# Patient Record
Sex: Female | Born: 1940 | Race: White | Hispanic: No | Smoking: Never smoker
Health system: Southern US, Community
[De-identification: ages and names within clinical notes are randomized; demographics above are authoritative.]

## PROBLEM LIST (undated history)

## (undated) DIAGNOSIS — M1990 Unspecified osteoarthritis, unspecified site: Secondary | ICD-10-CM

## (undated) DIAGNOSIS — F5102 Adjustment insomnia: Secondary | ICD-10-CM

## (undated) DIAGNOSIS — E782 Mixed hyperlipidemia: Secondary | ICD-10-CM

## (undated) DIAGNOSIS — E038 Other specified hypothyroidism: Secondary | ICD-10-CM

## (undated) HISTORY — DX: Adjustment insomnia: F51.02

## (undated) HISTORY — PX: TONSILLECTOMY: SUR1361

## (undated) HISTORY — DX: Other specified hypothyroidism: E03.8

## (undated) HISTORY — DX: Mixed hyperlipidemia: E78.2

## (undated) HISTORY — PX: HIP ARTHROPLASTY: SHX981

## (undated) HISTORY — PX: CATARACT EXTRACTION: SUR2

## (undated) HISTORY — PX: JOINT REPLACEMENT: SHX530

## (undated) HISTORY — DX: Unspecified osteoarthritis, unspecified site: M19.90

## (undated) HISTORY — PX: THYROIDECTOMY: SHX17

---

## 1997-05-16 ENCOUNTER — Other Ambulatory Visit: Admission: RE | Admit: 1997-05-16 | Discharge: 1997-05-16 | Payer: Self-pay | Admitting: Obstetrics & Gynecology

## 1997-12-23 ENCOUNTER — Other Ambulatory Visit: Admission: RE | Admit: 1997-12-23 | Discharge: 1997-12-23 | Payer: Self-pay | Admitting: Obstetrics & Gynecology

## 1998-05-29 ENCOUNTER — Other Ambulatory Visit: Admission: RE | Admit: 1998-05-29 | Discharge: 1998-05-29 | Payer: Self-pay | Admitting: Obstetrics & Gynecology

## 1999-07-18 ENCOUNTER — Other Ambulatory Visit: Admission: RE | Admit: 1999-07-18 | Discharge: 1999-07-18 | Payer: Self-pay | Admitting: Obstetrics & Gynecology

## 2000-08-25 ENCOUNTER — Other Ambulatory Visit: Admission: RE | Admit: 2000-08-25 | Discharge: 2000-08-25 | Payer: Self-pay | Admitting: Obstetrics & Gynecology

## 2001-09-16 ENCOUNTER — Other Ambulatory Visit: Admission: RE | Admit: 2001-09-16 | Discharge: 2001-09-16 | Payer: Self-pay | Admitting: Obstetrics & Gynecology

## 2002-10-05 ENCOUNTER — Other Ambulatory Visit: Admission: RE | Admit: 2002-10-05 | Discharge: 2002-10-05 | Payer: Self-pay | Admitting: Obstetrics & Gynecology

## 2003-10-25 ENCOUNTER — Other Ambulatory Visit: Admission: RE | Admit: 2003-10-25 | Discharge: 2003-10-25 | Payer: Self-pay | Admitting: Obstetrics & Gynecology

## 2013-08-05 ENCOUNTER — Other Ambulatory Visit: Payer: Self-pay | Admitting: Obstetrics & Gynecology

## 2013-08-05 DIAGNOSIS — R928 Other abnormal and inconclusive findings on diagnostic imaging of breast: Secondary | ICD-10-CM

## 2013-08-12 ENCOUNTER — Other Ambulatory Visit: Payer: Self-pay | Admitting: Obstetrics & Gynecology

## 2013-08-12 ENCOUNTER — Ambulatory Visit
Admission: RE | Admit: 2013-08-12 | Discharge: 2013-08-12 | Disposition: A | Discharge status: Home / Self Care | Payer: Medicare Other | Source: Ambulatory Visit | Attending: Obstetrics & Gynecology | Admitting: Obstetrics & Gynecology

## 2013-08-12 ENCOUNTER — Encounter (INDEPENDENT_AMBULATORY_CARE_PROVIDER_SITE_OTHER): Payer: Self-pay

## 2013-08-12 DIAGNOSIS — R928 Other abnormal and inconclusive findings on diagnostic imaging of breast: Secondary | ICD-10-CM

## 2015-03-13 DIAGNOSIS — E034 Atrophy of thyroid (acquired): Secondary | ICD-10-CM | POA: Diagnosis not present

## 2015-03-13 DIAGNOSIS — Z23 Encounter for immunization: Secondary | ICD-10-CM | POA: Diagnosis not present

## 2015-03-13 DIAGNOSIS — E782 Mixed hyperlipidemia: Secondary | ICD-10-CM | POA: Diagnosis not present

## 2015-03-13 DIAGNOSIS — E038 Other specified hypothyroidism: Secondary | ICD-10-CM | POA: Diagnosis not present

## 2015-03-13 DIAGNOSIS — F5102 Adjustment insomnia: Secondary | ICD-10-CM | POA: Diagnosis not present

## 2015-07-24 IMAGING — MG MM DIAG BREAST TOMO UNI LEFT
6 series · 6 of 14 positions shown · non-contrast
Comparison: Priors

CLINICAL DATA: Patient recalled from screening for possible left
breast distortion.

EXAM:
DIGITAL DIAGNOSTIC LEFT MAMMOGRAM WITH IMPLANTS, 3D TOMOSYNTHESIS
AND CAD
The patient has retropectoral/retroglandular implants. Standard and
implant displaced views were performed.

[L MLO synth-2D]
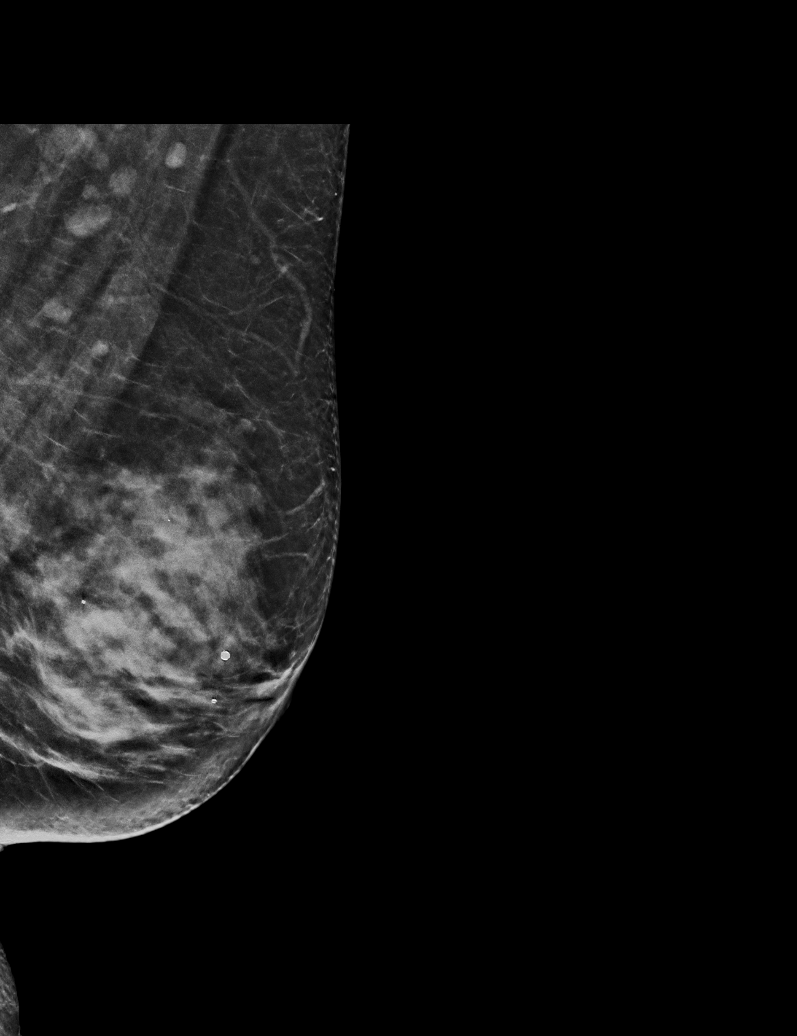

[L CC synth-2D]
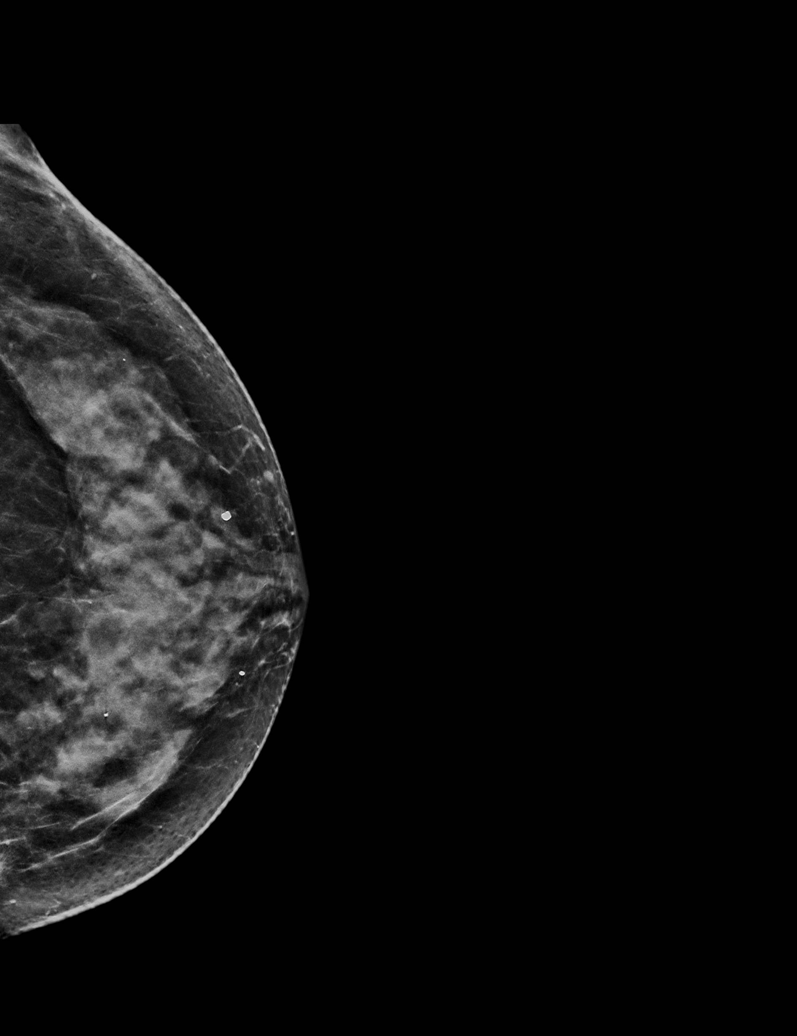

[L CC]
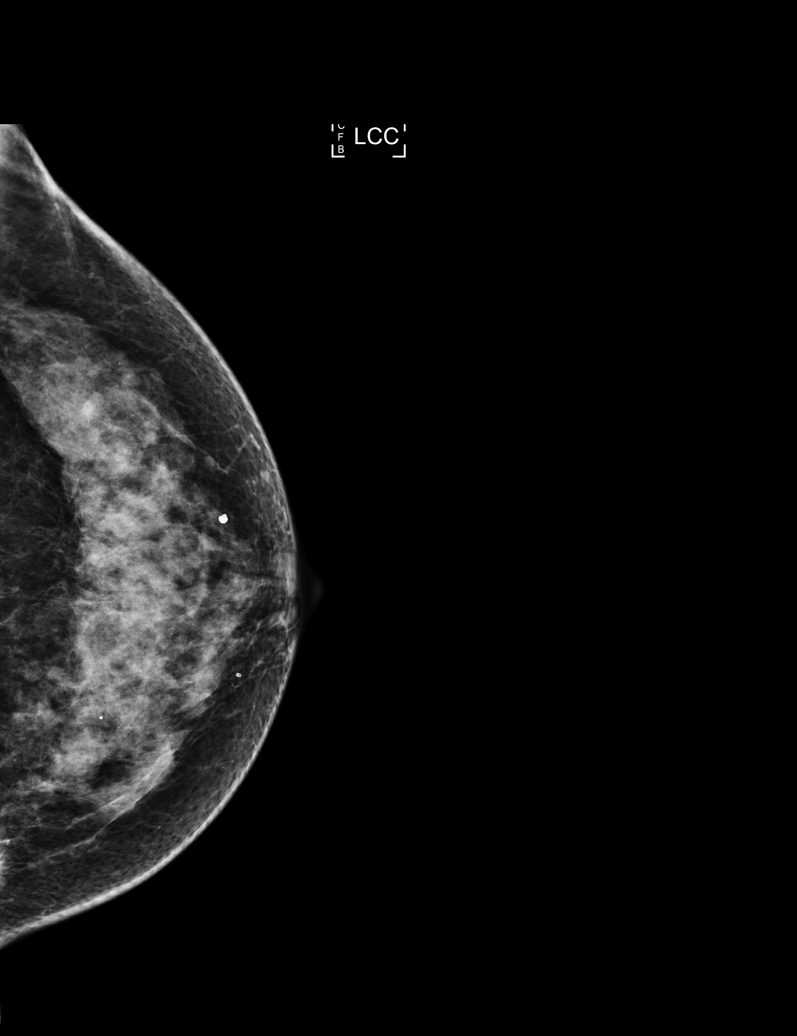

[L MLO]
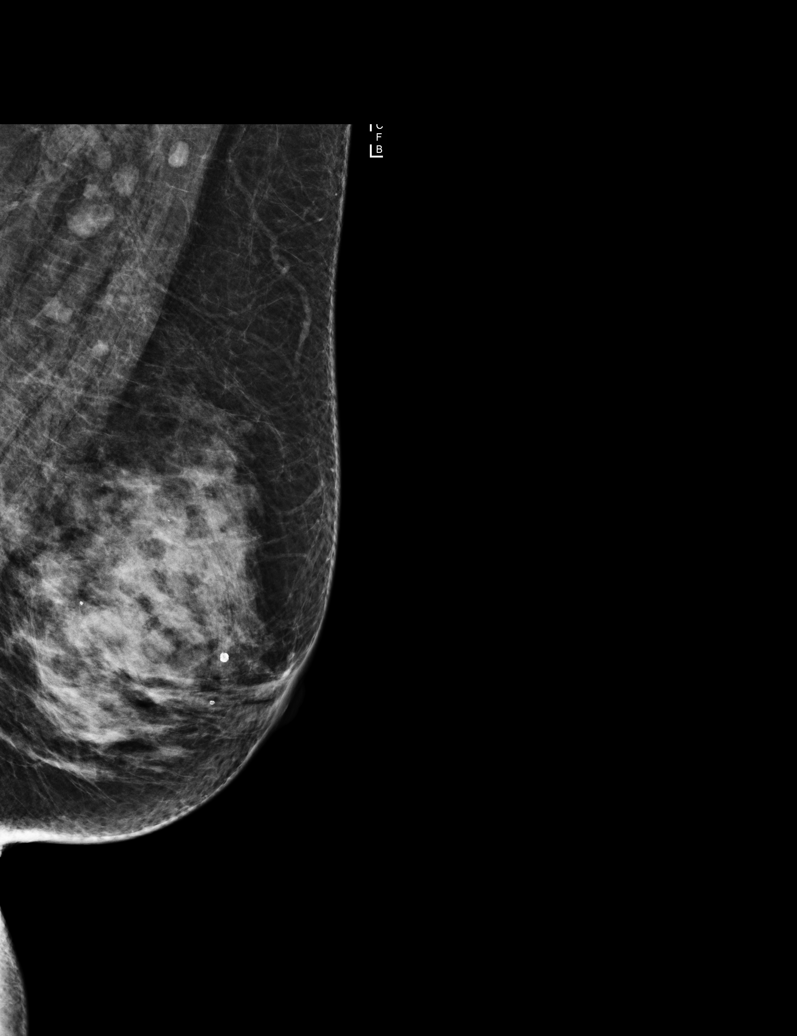

[L CC tomo · tomo slice 27/54.0]
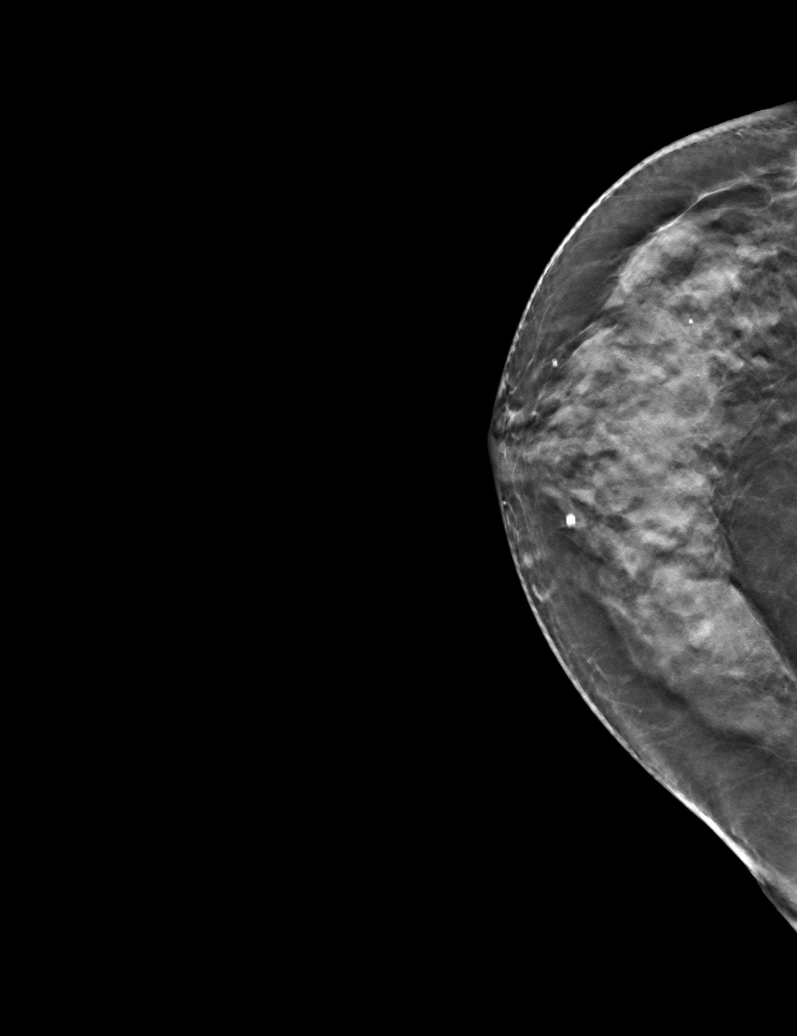

[L MLO tomo · tomo slice 31/60.0]
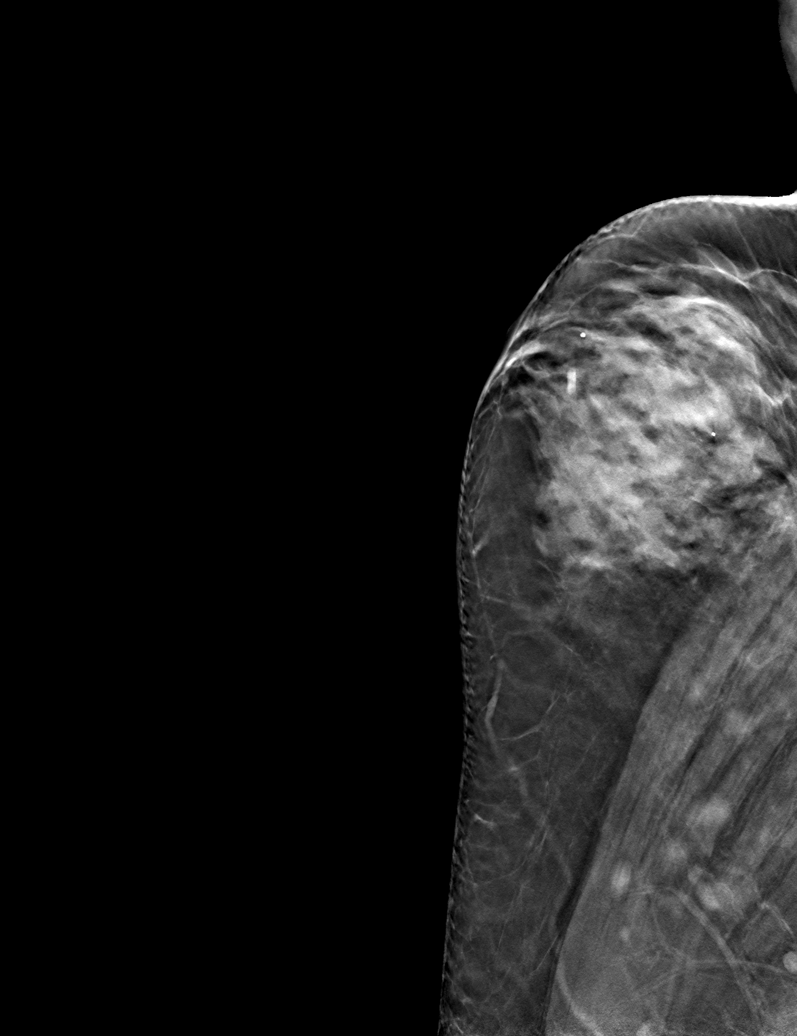

[6 of 14 positions shown; findings below may reference images not displayed]

ACR Breast Density Category c: The breast tissue is heterogeneously
dense, which may obscure small masses.
FINDINGS: Questioned distortion within the posterior lateral aspect of the
left breast on the CC view resolved with additional imaging,
tomosynthesis.

Mammographic images were processed with CAD.
IMPRESSION: No mammographic evidence for malignancy

RECOMMENDATION:
Screening mammogram in one year.(Code:QH-[id number])

I have discussed the findings and recommendations with the patient.
Results were also provided in writing at the conclusion of the
visit. If applicable, a reminder letter will be sent to the patient
regarding the next appointment.

BI-RADS CATEGORY  1: Negative.

## 2015-08-11 DIAGNOSIS — R21 Rash and other nonspecific skin eruption: Secondary | ICD-10-CM | POA: Diagnosis not present

## 2015-09-11 DIAGNOSIS — Z0000 Encounter for general adult medical examination without abnormal findings: Secondary | ICD-10-CM | POA: Diagnosis not present

## 2015-09-11 DIAGNOSIS — E038 Other specified hypothyroidism: Secondary | ICD-10-CM | POA: Diagnosis not present

## 2015-09-11 DIAGNOSIS — E782 Mixed hyperlipidemia: Secondary | ICD-10-CM | POA: Diagnosis not present

## 2015-09-11 DIAGNOSIS — Z6823 Body mass index (BMI) 23.0-23.9, adult: Secondary | ICD-10-CM | POA: Diagnosis not present

## 2015-10-16 DIAGNOSIS — Z124 Encounter for screening for malignant neoplasm of cervix: Secondary | ICD-10-CM | POA: Diagnosis not present

## 2015-10-16 DIAGNOSIS — Z01419 Encounter for gynecological examination (general) (routine) without abnormal findings: Secondary | ICD-10-CM | POA: Diagnosis not present

## 2015-10-16 DIAGNOSIS — Z1231 Encounter for screening mammogram for malignant neoplasm of breast: Secondary | ICD-10-CM | POA: Diagnosis not present

## 2015-10-16 DIAGNOSIS — R8271 Bacteriuria: Secondary | ICD-10-CM | POA: Diagnosis not present

## 2015-10-16 DIAGNOSIS — Z6822 Body mass index (BMI) 22.0-22.9, adult: Secondary | ICD-10-CM | POA: Diagnosis not present

## 2015-10-31 DIAGNOSIS — N3020 Other chronic cystitis without hematuria: Secondary | ICD-10-CM | POA: Diagnosis not present

## 2015-10-31 DIAGNOSIS — N870 Mild cervical dysplasia: Secondary | ICD-10-CM | POA: Diagnosis not present

## 2015-10-31 DIAGNOSIS — R87610 Atypical squamous cells of undetermined significance on cytologic smear of cervix (ASC-US): Secondary | ICD-10-CM | POA: Diagnosis not present

## 2015-10-31 DIAGNOSIS — R319 Hematuria, unspecified: Secondary | ICD-10-CM | POA: Diagnosis not present

## 2015-11-14 DIAGNOSIS — R87612 Low grade squamous intraepithelial lesion on cytologic smear of cervix (LGSIL): Secondary | ICD-10-CM | POA: Diagnosis not present

## 2016-02-05 DIAGNOSIS — R87612 Low grade squamous intraepithelial lesion on cytologic smear of cervix (LGSIL): Secondary | ICD-10-CM | POA: Diagnosis not present

## 2016-02-05 DIAGNOSIS — Z124 Encounter for screening for malignant neoplasm of cervix: Secondary | ICD-10-CM | POA: Diagnosis not present

## 2016-04-23 DIAGNOSIS — M9902 Segmental and somatic dysfunction of thoracic region: Secondary | ICD-10-CM | POA: Diagnosis not present

## 2016-04-23 DIAGNOSIS — M9905 Segmental and somatic dysfunction of pelvic region: Secondary | ICD-10-CM | POA: Diagnosis not present

## 2016-04-23 DIAGNOSIS — M545 Low back pain: Secondary | ICD-10-CM | POA: Diagnosis not present

## 2016-04-23 DIAGNOSIS — M62830 Muscle spasm of back: Secondary | ICD-10-CM | POA: Diagnosis not present

## 2016-04-23 DIAGNOSIS — M9903 Segmental and somatic dysfunction of lumbar region: Secondary | ICD-10-CM | POA: Diagnosis not present

## 2016-05-22 DIAGNOSIS — H25813 Combined forms of age-related cataract, bilateral: Secondary | ICD-10-CM | POA: Diagnosis not present

## 2016-05-22 DIAGNOSIS — H5203 Hypermetropia, bilateral: Secondary | ICD-10-CM | POA: Diagnosis not present

## 2016-05-27 DIAGNOSIS — M25551 Pain in right hip: Secondary | ICD-10-CM | POA: Diagnosis not present

## 2016-05-27 DIAGNOSIS — M47816 Spondylosis without myelopathy or radiculopathy, lumbar region: Secondary | ICD-10-CM | POA: Diagnosis not present

## 2016-05-27 DIAGNOSIS — M5186 Other intervertebral disc disorders, lumbar region: Secondary | ICD-10-CM | POA: Diagnosis not present

## 2016-05-27 DIAGNOSIS — M545 Low back pain: Secondary | ICD-10-CM | POA: Diagnosis not present

## 2016-05-27 DIAGNOSIS — M87851 Other osteonecrosis, right femur: Secondary | ICD-10-CM | POA: Diagnosis not present

## 2016-05-27 DIAGNOSIS — M1611 Unilateral primary osteoarthritis, right hip: Secondary | ICD-10-CM | POA: Diagnosis not present

## 2016-06-06 DIAGNOSIS — M87051 Idiopathic aseptic necrosis of right femur: Secondary | ICD-10-CM | POA: Diagnosis not present

## 2016-06-18 DIAGNOSIS — M1611 Unilateral primary osteoarthritis, right hip: Secondary | ICD-10-CM | POA: Diagnosis not present

## 2016-06-25 DIAGNOSIS — Z01810 Encounter for preprocedural cardiovascular examination: Secondary | ICD-10-CM | POA: Diagnosis not present

## 2016-06-25 DIAGNOSIS — Z01818 Encounter for other preprocedural examination: Secondary | ICD-10-CM | POA: Diagnosis not present

## 2016-06-25 DIAGNOSIS — M79609 Pain in unspecified limb: Secondary | ICD-10-CM | POA: Diagnosis not present

## 2016-06-25 DIAGNOSIS — Z79899 Other long term (current) drug therapy: Secondary | ICD-10-CM | POA: Diagnosis not present

## 2016-06-25 DIAGNOSIS — I700 Atherosclerosis of aorta: Secondary | ICD-10-CM | POA: Diagnosis not present

## 2016-06-25 DIAGNOSIS — R52 Pain, unspecified: Secondary | ICD-10-CM | POA: Diagnosis not present

## 2016-07-04 DIAGNOSIS — R8299 Other abnormal findings in urine: Secondary | ICD-10-CM | POA: Diagnosis not present

## 2016-07-04 DIAGNOSIS — Z01810 Encounter for preprocedural cardiovascular examination: Secondary | ICD-10-CM | POA: Diagnosis not present

## 2016-07-04 DIAGNOSIS — N3000 Acute cystitis without hematuria: Secondary | ICD-10-CM | POA: Diagnosis not present

## 2016-08-06 DIAGNOSIS — Z01818 Encounter for other preprocedural examination: Secondary | ICD-10-CM | POA: Diagnosis not present

## 2016-08-06 DIAGNOSIS — M79609 Pain in unspecified limb: Secondary | ICD-10-CM | POA: Diagnosis not present

## 2016-08-06 DIAGNOSIS — R52 Pain, unspecified: Secondary | ICD-10-CM | POA: Diagnosis not present

## 2016-08-06 DIAGNOSIS — Z79899 Other long term (current) drug therapy: Secondary | ICD-10-CM | POA: Diagnosis not present

## 2016-08-20 DIAGNOSIS — Z01818 Encounter for other preprocedural examination: Secondary | ICD-10-CM | POA: Diagnosis not present

## 2016-08-20 DIAGNOSIS — M87051 Idiopathic aseptic necrosis of right femur: Secondary | ICD-10-CM | POA: Diagnosis not present

## 2016-08-28 DIAGNOSIS — M87851 Other osteonecrosis, right femur: Secondary | ICD-10-CM | POA: Diagnosis not present

## 2016-08-28 DIAGNOSIS — Z79899 Other long term (current) drug therapy: Secondary | ICD-10-CM | POA: Diagnosis not present

## 2016-08-28 DIAGNOSIS — Z471 Aftercare following joint replacement surgery: Secondary | ICD-10-CM | POA: Diagnosis not present

## 2016-08-28 DIAGNOSIS — M1611 Unilateral primary osteoarthritis, right hip: Secondary | ICD-10-CM | POA: Diagnosis not present

## 2016-08-28 DIAGNOSIS — E039 Hypothyroidism, unspecified: Secondary | ICD-10-CM | POA: Diagnosis not present

## 2016-08-28 DIAGNOSIS — R531 Weakness: Secondary | ICD-10-CM | POA: Diagnosis not present

## 2016-08-28 DIAGNOSIS — D649 Anemia, unspecified: Secondary | ICD-10-CM | POA: Diagnosis not present

## 2016-08-28 DIAGNOSIS — M87051 Idiopathic aseptic necrosis of right femur: Secondary | ICD-10-CM | POA: Diagnosis not present

## 2016-08-28 DIAGNOSIS — Z96641 Presence of right artificial hip joint: Secondary | ICD-10-CM | POA: Diagnosis not present

## 2016-08-28 DIAGNOSIS — Z9181 History of falling: Secondary | ICD-10-CM | POA: Diagnosis not present

## 2016-08-28 DIAGNOSIS — E785 Hyperlipidemia, unspecified: Secondary | ICD-10-CM | POA: Diagnosis not present

## 2016-08-30 DIAGNOSIS — Z471 Aftercare following joint replacement surgery: Secondary | ICD-10-CM | POA: Diagnosis not present

## 2016-08-30 DIAGNOSIS — E039 Hypothyroidism, unspecified: Secondary | ICD-10-CM | POA: Diagnosis not present

## 2016-08-30 DIAGNOSIS — Z7982 Long term (current) use of aspirin: Secondary | ICD-10-CM | POA: Diagnosis not present

## 2016-08-30 DIAGNOSIS — E785 Hyperlipidemia, unspecified: Secondary | ICD-10-CM | POA: Diagnosis not present

## 2016-08-30 DIAGNOSIS — Z96641 Presence of right artificial hip joint: Secondary | ICD-10-CM | POA: Diagnosis not present

## 2016-09-04 DIAGNOSIS — D649 Anemia, unspecified: Secondary | ICD-10-CM | POA: Diagnosis not present

## 2016-09-10 DIAGNOSIS — Z96641 Presence of right artificial hip joint: Secondary | ICD-10-CM | POA: Diagnosis not present

## 2016-09-10 DIAGNOSIS — M25511 Pain in right shoulder: Secondary | ICD-10-CM | POA: Diagnosis not present

## 2016-09-10 DIAGNOSIS — M87851 Other osteonecrosis, right femur: Secondary | ICD-10-CM | POA: Diagnosis not present

## 2016-09-16 DIAGNOSIS — R2689 Other abnormalities of gait and mobility: Secondary | ICD-10-CM | POA: Diagnosis not present

## 2016-09-16 DIAGNOSIS — M6281 Muscle weakness (generalized): Secondary | ICD-10-CM | POA: Diagnosis not present

## 2016-09-16 DIAGNOSIS — M25551 Pain in right hip: Secondary | ICD-10-CM | POA: Diagnosis not present

## 2016-09-23 DIAGNOSIS — M25551 Pain in right hip: Secondary | ICD-10-CM | POA: Diagnosis not present

## 2016-09-23 DIAGNOSIS — M6281 Muscle weakness (generalized): Secondary | ICD-10-CM | POA: Diagnosis not present

## 2016-09-23 DIAGNOSIS — R2689 Other abnormalities of gait and mobility: Secondary | ICD-10-CM | POA: Diagnosis not present

## 2016-09-24 DIAGNOSIS — D649 Anemia, unspecified: Secondary | ICD-10-CM | POA: Diagnosis not present

## 2016-10-01 DIAGNOSIS — R2689 Other abnormalities of gait and mobility: Secondary | ICD-10-CM | POA: Diagnosis not present

## 2016-10-01 DIAGNOSIS — M6281 Muscle weakness (generalized): Secondary | ICD-10-CM | POA: Diagnosis not present

## 2016-10-01 DIAGNOSIS — M25551 Pain in right hip: Secondary | ICD-10-CM | POA: Diagnosis not present

## 2016-10-08 DIAGNOSIS — M1611 Unilateral primary osteoarthritis, right hip: Secondary | ICD-10-CM | POA: Diagnosis not present

## 2016-10-08 DIAGNOSIS — M6281 Muscle weakness (generalized): Secondary | ICD-10-CM | POA: Diagnosis not present

## 2016-10-08 DIAGNOSIS — M25551 Pain in right hip: Secondary | ICD-10-CM | POA: Diagnosis not present

## 2016-10-08 DIAGNOSIS — R2689 Other abnormalities of gait and mobility: Secondary | ICD-10-CM | POA: Diagnosis not present

## 2016-10-30 DIAGNOSIS — E782 Mixed hyperlipidemia: Secondary | ICD-10-CM | POA: Diagnosis not present

## 2016-10-30 DIAGNOSIS — Z0000 Encounter for general adult medical examination without abnormal findings: Secondary | ICD-10-CM | POA: Diagnosis not present

## 2016-10-30 DIAGNOSIS — Z1211 Encounter for screening for malignant neoplasm of colon: Secondary | ICD-10-CM | POA: Diagnosis not present

## 2016-10-30 DIAGNOSIS — Z6823 Body mass index (BMI) 23.0-23.9, adult: Secondary | ICD-10-CM | POA: Diagnosis not present

## 2016-11-01 DIAGNOSIS — E782 Mixed hyperlipidemia: Secondary | ICD-10-CM | POA: Diagnosis not present

## 2016-11-19 DIAGNOSIS — Z96641 Presence of right artificial hip joint: Secondary | ICD-10-CM | POA: Diagnosis not present

## 2016-12-03 DIAGNOSIS — R87612 Low grade squamous intraepithelial lesion on cytologic smear of cervix (LGSIL): Secondary | ICD-10-CM | POA: Diagnosis not present

## 2016-12-03 DIAGNOSIS — Z01419 Encounter for gynecological examination (general) (routine) without abnormal findings: Secondary | ICD-10-CM | POA: Diagnosis not present

## 2016-12-03 DIAGNOSIS — Z1231 Encounter for screening mammogram for malignant neoplasm of breast: Secondary | ICD-10-CM | POA: Diagnosis not present

## 2016-12-03 DIAGNOSIS — Z124 Encounter for screening for malignant neoplasm of cervix: Secondary | ICD-10-CM | POA: Diagnosis not present

## 2016-12-03 DIAGNOSIS — Z6823 Body mass index (BMI) 23.0-23.9, adult: Secondary | ICD-10-CM | POA: Diagnosis not present

## 2017-02-20 DIAGNOSIS — Z96641 Presence of right artificial hip joint: Secondary | ICD-10-CM | POA: Diagnosis not present

## 2017-08-20 DIAGNOSIS — H04123 Dry eye syndrome of bilateral lacrimal glands: Secondary | ICD-10-CM | POA: Diagnosis not present

## 2017-08-25 DIAGNOSIS — Z96641 Presence of right artificial hip joint: Secondary | ICD-10-CM | POA: Diagnosis not present

## 2017-09-23 DIAGNOSIS — E782 Mixed hyperlipidemia: Secondary | ICD-10-CM | POA: Diagnosis not present

## 2017-09-23 DIAGNOSIS — E038 Other specified hypothyroidism: Secondary | ICD-10-CM | POA: Diagnosis not present

## 2017-10-14 DIAGNOSIS — H25811 Combined forms of age-related cataract, right eye: Secondary | ICD-10-CM | POA: Diagnosis not present

## 2017-10-14 DIAGNOSIS — H353131 Nonexudative age-related macular degeneration, bilateral, early dry stage: Secondary | ICD-10-CM | POA: Diagnosis not present

## 2017-10-14 DIAGNOSIS — Z01818 Encounter for other preprocedural examination: Secondary | ICD-10-CM | POA: Diagnosis not present

## 2017-10-21 DIAGNOSIS — H04123 Dry eye syndrome of bilateral lacrimal glands: Secondary | ICD-10-CM | POA: Diagnosis not present

## 2017-10-21 DIAGNOSIS — E039 Hypothyroidism, unspecified: Secondary | ICD-10-CM | POA: Diagnosis not present

## 2017-10-21 DIAGNOSIS — H25811 Combined forms of age-related cataract, right eye: Secondary | ICD-10-CM | POA: Diagnosis not present

## 2017-10-21 DIAGNOSIS — H43393 Other vitreous opacities, bilateral: Secondary | ICD-10-CM | POA: Diagnosis not present

## 2017-10-21 DIAGNOSIS — E785 Hyperlipidemia, unspecified: Secondary | ICD-10-CM | POA: Diagnosis not present

## 2017-10-21 DIAGNOSIS — D649 Anemia, unspecified: Secondary | ICD-10-CM | POA: Diagnosis not present

## 2017-10-21 DIAGNOSIS — Z79899 Other long term (current) drug therapy: Secondary | ICD-10-CM | POA: Diagnosis not present

## 2017-10-21 DIAGNOSIS — H259 Unspecified age-related cataract: Secondary | ICD-10-CM | POA: Diagnosis not present

## 2017-10-28 HISTORY — PX: CATARACT EXTRACTION: SUR2

## 2017-11-03 DIAGNOSIS — Z0000 Encounter for general adult medical examination without abnormal findings: Secondary | ICD-10-CM | POA: Diagnosis not present

## 2017-11-03 DIAGNOSIS — Z1211 Encounter for screening for malignant neoplasm of colon: Secondary | ICD-10-CM | POA: Diagnosis not present

## 2017-11-03 DIAGNOSIS — E038 Other specified hypothyroidism: Secondary | ICD-10-CM | POA: Diagnosis not present

## 2017-11-03 DIAGNOSIS — Z6823 Body mass index (BMI) 23.0-23.9, adult: Secondary | ICD-10-CM | POA: Diagnosis not present

## 2017-11-03 DIAGNOSIS — Z23 Encounter for immunization: Secondary | ICD-10-CM | POA: Diagnosis not present

## 2017-11-03 DIAGNOSIS — E782 Mixed hyperlipidemia: Secondary | ICD-10-CM | POA: Diagnosis not present

## 2017-11-18 DIAGNOSIS — Z79899 Other long term (current) drug therapy: Secondary | ICD-10-CM | POA: Diagnosis not present

## 2017-11-18 DIAGNOSIS — E0500 Thyrotoxicosis with diffuse goiter without thyrotoxic crisis or storm: Secondary | ICD-10-CM | POA: Diagnosis not present

## 2017-11-18 DIAGNOSIS — H25812 Combined forms of age-related cataract, left eye: Secondary | ICD-10-CM | POA: Diagnosis not present

## 2017-11-18 DIAGNOSIS — H259 Unspecified age-related cataract: Secondary | ICD-10-CM | POA: Diagnosis not present

## 2017-11-18 DIAGNOSIS — E785 Hyperlipidemia, unspecified: Secondary | ICD-10-CM | POA: Diagnosis not present

## 2018-02-02 DIAGNOSIS — Z1211 Encounter for screening for malignant neoplasm of colon: Secondary | ICD-10-CM | POA: Diagnosis not present

## 2018-02-17 DIAGNOSIS — Z124 Encounter for screening for malignant neoplasm of cervix: Secondary | ICD-10-CM | POA: Diagnosis not present

## 2018-02-17 DIAGNOSIS — Z6824 Body mass index (BMI) 24.0-24.9, adult: Secondary | ICD-10-CM | POA: Diagnosis not present

## 2018-02-17 DIAGNOSIS — Z01419 Encounter for gynecological examination (general) (routine) without abnormal findings: Secondary | ICD-10-CM | POA: Diagnosis not present

## 2018-02-17 DIAGNOSIS — Z1231 Encounter for screening mammogram for malignant neoplasm of breast: Secondary | ICD-10-CM | POA: Diagnosis not present

## 2018-11-05 DIAGNOSIS — Z0000 Encounter for general adult medical examination without abnormal findings: Secondary | ICD-10-CM | POA: Diagnosis not present

## 2018-11-05 DIAGNOSIS — Z23 Encounter for immunization: Secondary | ICD-10-CM | POA: Diagnosis not present

## 2018-11-17 DIAGNOSIS — E782 Mixed hyperlipidemia: Secondary | ICD-10-CM | POA: Diagnosis not present

## 2018-11-17 DIAGNOSIS — E038 Other specified hypothyroidism: Secondary | ICD-10-CM | POA: Diagnosis not present

## 2018-12-25 DIAGNOSIS — S0181XA Laceration without foreign body of other part of head, initial encounter: Secondary | ICD-10-CM | POA: Diagnosis not present

## 2018-12-25 DIAGNOSIS — Z9181 History of falling: Secondary | ICD-10-CM | POA: Diagnosis not present

## 2019-04-19 DIAGNOSIS — Z1231 Encounter for screening mammogram for malignant neoplasm of breast: Secondary | ICD-10-CM | POA: Diagnosis not present

## 2019-04-19 DIAGNOSIS — Z6824 Body mass index (BMI) 24.0-24.9, adult: Secondary | ICD-10-CM | POA: Diagnosis not present

## 2019-04-19 DIAGNOSIS — Z01419 Encounter for gynecological examination (general) (routine) without abnormal findings: Secondary | ICD-10-CM | POA: Diagnosis not present

## 2019-04-29 ENCOUNTER — Encounter: Payer: Self-pay | Admitting: Family Medicine

## 2019-05-20 ENCOUNTER — Encounter: Payer: Self-pay | Admitting: Family Medicine

## 2019-05-20 DIAGNOSIS — E038 Other specified hypothyroidism: Secondary | ICD-10-CM | POA: Insufficient documentation

## 2019-05-20 NOTE — Progress Notes (Signed)
Subjective:  Patient ID: Janet Richardson, female    DOB: 06-Dec-1940  Age: 79 y.o. MRN: [id number]  Chief Complaint  Patient presents with  . Hyperlipidemia  . Hypothyroidism    HPI In regard to the other specified hypothyroidism, she is currently taking Synthroid, 100 mcg daily.  TSH was last checked 6 months ago.  The result was reported as normal.  She denies any related symptoms.      Pt presents with hyperlipidemia.  She is currently on zocor. Compliance with treatment has been good; she takes her medication as directed, maintains her low cholesterol diet, follows up as directed, and maintains her exercise regimen.  She denies experiencing any hypercholesterolemia related symptoms.    Social Hx   Social History   Socioeconomic History  . Marital status: Divorced    Spouse name: Not on file  . Number of children: 2  . Years of education: Not on file  . Highest education level: Not on file  Occupational History  . Occupation: Print production planner    CommentManufacturing engineer  Tobacco Use  . Smoking status: Never Smoker  . Smokeless tobacco: Never Used  Substance and Sexual Activity  . Alcohol use: Yes    Alcohol/week: 1.0 standard drinks    Types: 1 Glasses of wine per week  . Drug use: Never  . Sexual activity: Not Currently  Other Topics Concern  . Not on file  Social History Narrative  . Not on file   Social Determinants of Health   Financial Resource Strain:   . Difficulty of Paying Living Expenses:   Food Insecurity:   . Worried About Programme researcher, broadcasting/film/video in the Last Year:   . Barista in the Last Year:   Transportation Needs:   . Freight forwarder (Medical):   Marland Kitchen Lack of Transportation (Non-Medical):   Physical Activity:   . Days of Exercise per Week:   . Minutes of Exercise per Session:   Stress:   . Feeling of Stress :   Social Connections:   . Frequency of Communication with Friends and Family:   . Frequency of Social Gatherings with  Friends and Family:   . Attends Religious Services:   . Active Member of Clubs or Organizations:   . Attends Banker Meetings:   Marland Kitchen Marital Status:    Past Medical History:  Diagnosis Date  . Adjustment insomnia   . Mixed hyperlipidemia   . Osteoarthritis   . Other specified hypothyroidism    Family History  Problem Relation Age of Onset  . Heart failure Father   . Cancer Brother        lung  . Cancer Brother        prostate  . Cancer Brother        prostate    Review of Systems  Constitutional: Negative for chills, fatigue and fever.  HENT: Negative for congestion, ear pain, rhinorrhea and sore throat.   Respiratory: Negative for cough and shortness of breath.   Cardiovascular: Negative for chest pain.  Gastrointestinal: Negative for abdominal pain, constipation, diarrhea, nausea and vomiting.  Genitourinary: Negative for dysuria and urgency.  Musculoskeletal: Negative for arthralgias, back pain and myalgias.  Neurological: Negative for dizziness.  Psychiatric/Behavioral: Negative for dysphoric mood. The patient is not nervous/anxious.        No anhedonia.     Objective:  BP 130/70   Pulse 60   Temp 98.7 F (37.1 C)  Resp 16   Ht 5\' 5"  (1.651 m)   Wt 149 lb 9.6 oz (67.9 kg)   BMI 24.89 kg/m   BP/Weight 6/60/6301  Systolic BP 601  Diastolic BP 70  Wt. (Lbs) 149.6  BMI 24.89    Physical Exam Vitals reviewed.  Constitutional:      General: She is not in acute distress.    Appearance: Normal appearance. She is normal weight. She is not ill-appearing.  Neck:     Vascular: No carotid bruit.  Cardiovascular:     Rate and Rhythm: Normal rate and regular rhythm.     Heart sounds: Normal heart sounds.  Pulmonary:     Effort: Pulmonary effort is normal. No respiratory distress.     Breath sounds: Normal breath sounds.  Abdominal:     General: Bowel sounds are normal.     Palpations: Abdomen is soft.     Tenderness: There is no abdominal  tenderness.  Neurological:     Mental Status: She is alert.  Psychiatric:        Mood and Affect: Mood normal.        Behavior: Behavior normal.     No results found for: WBC, HGB, HCT, PLT, GLUCOSE, CHOL, TRIG, HDL, LDLDIRECT, LDLCALC, ALT, AST, NA, K, CL, CREATININE, BUN, CO2, TSH, PSA, INR, GLUF, HGBA1C, MICROALBUR    Assessment & Plan:   1. Other specified hypothyroidism The current medical regimen is effective;  continue present plan and medications. Future orders - TSH  2. Mixed hyperlipidemia Well controlled.  No changes to medicines.  Continue to work on eating a healthy diet and exercise.  Labs reviewed.  Future orders.  - Lipid panel - Comprehensive metabolic panel - CBC with Differential/Platelet  3. Other specified menopausal and perimenopausal disorders Order dexa - DG Bone Density; Future   Follow-up: Return in about 1 year (around 05/23/2020) for fasting visit with me. AWV with Maudie Mercury in October after 11/06/2019.Marland Kitchen  An After Visit Summary was printed and given to the patient.  Rochel Brome [facility] [phone]

## 2019-05-24 ENCOUNTER — Other Ambulatory Visit: Payer: Self-pay

## 2019-05-24 ENCOUNTER — Encounter: Payer: Self-pay | Admitting: Family Medicine

## 2019-05-24 ENCOUNTER — Ambulatory Visit (INDEPENDENT_AMBULATORY_CARE_PROVIDER_SITE_OTHER): Payer: PPO | Admitting: Family Medicine

## 2019-05-24 VITALS — BP 130/70 | HR 60 | Temp 98.7°F | Resp 16 | Ht 65.0 in | Wt 149.6 lb

## 2019-05-24 DIAGNOSIS — E782 Mixed hyperlipidemia: Secondary | ICD-10-CM

## 2019-05-24 DIAGNOSIS — E038 Other specified hypothyroidism: Secondary | ICD-10-CM | POA: Diagnosis not present

## 2019-05-24 DIAGNOSIS — N958 Other specified menopausal and perimenopausal disorders: Secondary | ICD-10-CM | POA: Insufficient documentation

## 2019-05-25 LAB — COMPREHENSIVE METABOLIC PANEL
ALT: 18 IU/L (ref 0–32)
AST: 20 IU/L (ref 0–40)
Albumin/Globulin Ratio: 2.1 (ref 1.2–2.2)
Albumin: 4.8 g/dL — ABNORMAL HIGH (ref 3.7–4.7)
Alkaline Phosphatase: 75 IU/L (ref 39–117)
BUN/Creatinine Ratio: 24 (ref 12–28)
BUN: 19 mg/dL (ref 8–27)
Bilirubin Total: 0.9 mg/dL (ref 0.0–1.2)
CO2: 23 mmol/L (ref 20–29)
Calcium: 9.8 mg/dL (ref 8.7–10.3)
Chloride: 101 mmol/L (ref 96–106)
Creatinine, Ser: 0.79 mg/dL (ref 0.57–1.00)
GFR calc Af Amer: 84 mL/min/{1.73_m2} (ref >59)
GFR calc non Af Amer: 72 mL/min/{1.73_m2} (ref >59)
Globulin, Total: 2.3 g/dL (ref 1.5–4.5)
Glucose: 87 mg/dL (ref 65–99)
Potassium: 4.4 mmol/L (ref 3.5–5.2)
Sodium: 141 mmol/L (ref 134–144)
Total Protein: 7.1 g/dL (ref 6.0–8.5)

## 2019-05-25 LAB — CBC WITH DIFFERENTIAL/PLATELET
Basophils Absolute: 0.1 10*3/uL (ref 0.0–0.2)
Basos: 1 %
EOS (ABSOLUTE): 0.2 10*3/uL (ref 0.0–0.4)
Eos: 2 %
Hematocrit: 44.3 % (ref 34.0–46.6)
Hemoglobin: 14.6 g/dL (ref 11.1–15.9)
Immature Grans (Abs): 0 10*3/uL (ref 0.0–0.1)
Immature Granulocytes: 0 %
Lymphocytes Absolute: 2.1 10*3/uL (ref 0.7–3.1)
Lymphs: 25 %
MCH: 32.4 pg (ref 26.6–33.0)
MCHC: 33 g/dL (ref 31.5–35.7)
MCV: 98 fL — ABNORMAL HIGH (ref 79–97)
Monocytes Absolute: 0.7 10*3/uL (ref 0.1–0.9)
Monocytes: 9 %
Neutrophils Absolute: 5.4 10*3/uL (ref 1.4–7.0)
Neutrophils: 63 %
Platelets: 332 10*3/uL (ref 150–450)
RBC: 4.5 x10E6/uL (ref 3.77–5.28)
RDW: 12.5 % (ref 11.7–15.4)
WBC: 8.5 10*3/uL (ref 3.4–10.8)

## 2019-05-25 LAB — LIPID PANEL
Chol/HDL Ratio: 2.5 ratio (ref 0.0–4.4)
Cholesterol, Total: 215 mg/dL — ABNORMAL HIGH (ref 100–199)
HDL: 85 mg/dL (ref >39)
LDL Chol Calc (NIH): 116 mg/dL — ABNORMAL HIGH (ref 0–99)
Triglycerides: 81 mg/dL (ref 0–149)
VLDL Cholesterol Cal: 14 mg/dL (ref 5–40)

## 2019-05-25 LAB — TSH: TSH: 3.13 u[IU]/mL (ref 0.450–4.500)

## 2019-05-25 LAB — CARDIOVASCULAR RISK ASSESSMENT

## 2019-07-12 ENCOUNTER — Other Ambulatory Visit: Payer: Self-pay | Admitting: Family Medicine

## 2019-08-31 ENCOUNTER — Other Ambulatory Visit: Payer: Self-pay | Admitting: Family Medicine

## 2019-11-23 ENCOUNTER — Other Ambulatory Visit: Payer: Self-pay

## 2019-11-23 ENCOUNTER — Encounter: Payer: Self-pay | Admitting: Nurse Practitioner

## 2019-11-23 ENCOUNTER — Ambulatory Visit (INDEPENDENT_AMBULATORY_CARE_PROVIDER_SITE_OTHER): Payer: PPO | Admitting: Nurse Practitioner

## 2019-11-23 VITALS — BP 109/64 | HR 76 | Temp 98.0°F | Ht 64.0 in | Wt 149.4 lb

## 2019-11-23 DIAGNOSIS — Z7189 Other specified counseling: Secondary | ICD-10-CM

## 2019-11-23 DIAGNOSIS — Z780 Asymptomatic menopausal state: Secondary | ICD-10-CM

## 2019-11-23 DIAGNOSIS — Z23 Encounter for immunization: Secondary | ICD-10-CM | POA: Diagnosis not present

## 2019-11-23 DIAGNOSIS — Z0000 Encounter for general adult medical examination without abnormal findings: Secondary | ICD-10-CM

## 2019-11-23 DIAGNOSIS — Z13820 Encounter for screening for osteoporosis: Secondary | ICD-10-CM | POA: Diagnosis not present

## 2019-11-23 DIAGNOSIS — Z6825 Body mass index (BMI) 25.0-25.9, adult: Secondary | ICD-10-CM | POA: Diagnosis not present

## 2019-11-23 NOTE — Progress Notes (Signed)
Subjective:  Patient ID: Janet Richardson, female    DOB: 10-16-1940  Age: 79 y.o. MRN: [id number]  Chief Complaint  Patient presents with  . Annual Exam    AWV    HPI  Janet Richardson is a 79 year old Caucasian female present for Annual Medicare Wellness exam. She denies any problems today. She has received all immunizations recommended for her age including COVID-19. She received flu vaccine today. She is up-to-date on mammogram, colonoscopy, dental, and eye exam. She has a past medical history of hypothyroidism, hyperlipidemia, and osteoarthritis with right hip arthroplasty. She exercises regularly and consumes a heart healthy diet.BMI is 25.64.  Encounter for general adult medical examination without abnormal findings Physical ("At Risk" items are starred): Patient's last physical exam was 1 year ago .  Smoking: Life-long non-smoker ;  Physical Activity: Exercises at least 3 times per week ;  Alcohol/Drug Use: Is a non-drinker ; No illicit drug use ;  Patient is not afflicted from Stress Incontinence and Urge Incontinence  Safety: reviewed ; Patient wears a seat belt, has smoke detectors, has carbon monoxide detectors, and wears sunscreen with extended sun exposure. Dental Care: biannual cleanings, brushes and flosses daily. Ophthalmology/Optometry: Annual visit.  Hearing loss: none Vision impairments: Has cataract lens implants bilaterally    Fall Risk  11/23/2019 05/24/2019  Falls in the past year? 0 1  Number falls in past yr: 0 0  Injury with Fall? 0 1  Risk for fall due to : No Fall Risks -  Follow up - Falls prevention discussed;Falls evaluation completed     Depression screen [facility] 2/9 11/23/2019 05/24/2019  Decreased Interest 0 0  Down, Depressed, Hopeless 0 0  PHQ - 2 Score 0 0       Functional Status Survey: Is the patient deaf or have difficulty hearing?: No Does the patient have difficulty seeing, even when wearing glasses/contacts?: No Does the patient have difficulty concentrating, remembering, or making decisions?: No Does the patient have difficulty walking or climbing stairs?: No Does the patient have difficulty dressing or bathing?: No Does the patient have difficulty doing errands alone such as visiting a doctor's office or shopping?: No   Social Hx   Social History   Socioeconomic History  . Marital status: Divorced    Spouse name: Not on file  . Number of children: 2  . Years of education: Not on file  . Highest education level: Not on file  Occupational History  . Occupation: Print production planner    CommentPatent attorney  Tobacco Use  . Smoking status: Never Smoker  . Smokeless tobacco: Never Used  Substance and Sexual Activity  . Alcohol use: Yes    Alcohol/week: 1.0 standard drink    Types: 1 Glasses of wine per week  . Drug use: Never  . Sexual activity: Not Currently  Other Topics Concern  . Not on file  Social History Narrative  . Not on file   Social Determinants of Health   Financial Resource Strain: Low Risk   . Difficulty of Paying Living Expenses: Not hard at all  Food Insecurity: No Food Insecurity  . Worried About Programme researcher, broadcasting/film/video in the Last Year: Never true  . Ran Out of Food in the Last Year: Never true  Transportation Needs: No Transportation Needs  . Lack of Transportation (Medical): No  . Lack of Transportation (Non-Medical): No  Physical Activity: Sufficiently Active  . Days of Exercise per Week: 3 days  . Minutes of Exercise per Session: 50 min  Stress: No Stress Concern Present  . Feeling of Stress : Not at all  Social Connections: Moderately Integrated  . Frequency of Communication with Friends and Family: More than three times a week  . Frequency of Social Gatherings with Friends and Family: More than three times a week  . Attends Religious Services: 1 to 4 times per year  . Active Member of Clubs or Organizations: Yes  . Attends Banker Meetings: 1 to 4 times per year  . Marital Status: Divorced   Past Medical History:  Diagnosis Date  . Adjustment insomnia   .  Mixed hyperlipidemia   . Osteoarthritis   . Other specified hypothyroidism    Family History  Problem Relation Age of Onset  . Heart failure Father   . Cancer Brother        lung  . Cancer Brother        prostate    Review of Systems  Constitutional: Negative for appetite change, fatigue and fever.  HENT: Negative for hearing loss, tinnitus and trouble swallowing.   Eyes: Negative for visual disturbance.  Respiratory: Negative for cough and shortness of breath.     Cardiovascular: Negative for chest pain, palpitations and leg swelling.  Gastrointestinal: Negative for constipation, diarrhea, nausea and vomiting.  Endocrine: Negative for cold intolerance, heat intolerance, polydipsia, polyphagia and polyuria.  Genitourinary: Negative for dysuria, frequency and urgency.  Musculoskeletal: Negative for arthralgias and myalgias.  Skin: Negative for rash.  Neurological: Negative for dizziness and headaches.  Psychiatric/Behavioral: Negative for sleep disturbance. The patient is not nervous/anxious.      Objective:  BP 109/64 (BP Location: Right Arm, Patient Position: Sitting, Cuff Size: Normal)   Pulse 76   Temp 98 F (36.7 C) (Temporal)   Ht 5\' 4"  (1.626 m)   Wt 149 lb 6.4 oz (67.8 kg)   SpO2 93%   BMI 25.64 kg/m   BP/Weight 11/23/2019 05/24/2019  Systolic BP 109 130  Diastolic BP 64 70  Wt. (Lbs) 149.4 149.6  BMI 25.64 24.89    Physical Exam Vitals reviewed.  Constitutional:      Appearance: Normal appearance.  HENT:     Head: Normocephalic.     Mouth/Throat:     Mouth: Mucous membranes are moist.  Cardiovascular:     Rate and Rhythm: Normal rate and regular rhythm.     Heart sounds: Normal heart sounds.  Pulmonary:     Effort: Pulmonary effort is normal.     Breath sounds: Normal breath sounds.  Abdominal:     General: Bowel sounds are normal.     Palpations: Abdomen is soft.  Skin:    General: Skin is warm and dry.     Capillary Refill: Capillary refill takes less than 2 seconds.  Neurological:     General: No focal deficit present.     Mental Status: She is alert and oriented to person, place, and time.  Psychiatric:        Mood and Affect: Mood normal.        Behavior: Behavior normal.     Lab Results  Component Value Date   WBC 8.5 05/24/2019   HGB 14.6 05/24/2019   HCT 44.3 05/24/2019   PLT 332 05/24/2019   GLUCOSE 87 05/24/2019   CHOL 215 (H) 05/24/2019   TRIG 81 05/24/2019   HDL 85 05/24/2019   LDLCALC 116  (H) 05/24/2019   ALT 18 05/24/2019   AST 20 05/24/2019   NA 141 05/24/2019   K 4.4 05/24/2019   CL 101 05/24/2019   CREATININE 0.79 05/24/2019   BUN 19 05/24/2019   CO2 23 05/24/2019   TSH 3.130 05/24/2019      Assessment & Plan:    1. Medicare annual wellness visit, initial - PR ADVANCE CARE PLANNING FIRST 30 MINS - DG Bone Density  2. Need for immunization against influenza - Flu Vaccine QUAD High Dose(Fluad)  3. Advance directive discussed with patient - PR ADVANCE CARE PLANNING FIRST 30 MINS  4. BMI 25.0-25.9,adult -Continue heart healthy diet and exercising  5. Encounter for osteoporosis screening in asymptomatic  postmenopausal patient - DG Bone Density -Continue Calcium with Vitamin D supplement -Continue heart healthy diet and exercising  These are the goals we discussed: Goals   Complete Advance Directive Packet      This is a list of the screening recommended for you and due dates:  Health Maintenance  Topic Date Due  .  Hepatitis C: One time screening is recommended by Center for Disease Control  (CDC) for  adults born from [birth year] through 1964.   Never done  . DEXA scan (bone density measurement)  To be scheduled  . Flu Shot  08/29/2019  . Mammogram  04/18/2020  . Tetanus Vaccine  02/19/2021  . COVID-19 Vaccine  Completed  . Pneumonia vaccines  Completed     AN INDIVIDUALIZED CARE PLAN: was established or reinforced today.   SELF MANAGEMENT: The patient and I together assessed ways to personally work towards obtaining the recommended goals  Support needs The patient and/or family needs were assessed and services were offered and not necessary at this time.    Follow-up: Return for regular scheduled appointment. Janie Morning, DNP [facility] [phone]

## 2019-11-23 NOTE — Patient Instructions (Addendum)
Flu shot given today Continue heart healthy diet and exercise Complete advance directive and bring copy to office Advance Directive  Advance directives are legal documents that let you make choices ahead of time about your health care and medical treatment in case you become unable to communicate for yourself. Advance directives are a way for you to make known your wishes to family, friends, and health care providers. This can let others know about your end-of-life care if you become unable to communicate. Discussing and writing advance directives should happen over time rather than all at once. Advance directives can be changed depending on your situation and what you want, even after you have signed the advance directives. There are different types of advance directives, such as:  Medical power of attorney.  Living will.  Do not resuscitate (DNR) or do not attempt resuscitation (DNAR) order. Health care proxy and medical power of attorney A health care proxy is also called a health care agent. This is a person who is appointed to make medical decisions for you in cases where you are unable to make the decisions yourself. Generally, people choose someone they know well and trust to represent their preferences. Make sure to ask this person for an agreement to act as your proxy. A proxy may have to exercise judgment in the event of a medical decision for which your wishes are not known. A medical power of attorney is a legal document that names your health care proxy. Depending on the laws in your state, after the document is written, it may also need to be:  Signed.  Notarized.  Dated.  Copied.  Witnessed.  Incorporated into your medical record. You may also want to appoint someone to manage your money in a situation in which you are unable to do so. This is called a durable power of attorney for finances. It is a separate legal document from the durable power of attorney for health  care. You may choose the same person or someone different from your health care proxy to act as your agent in money matters. If you do not appoint a proxy, or if there is a concern that the proxy is not acting in your best interests, a court may appoint a guardian to act on your behalf. Living will A living will is a set of instructions that state your wishes about medical care when you cannot express them yourself. Health care providers should keep a copy of your living will in your medical record. You may want to give a copy to family members or friends. To alert caregivers in case of an emergency, you can place a card in your wallet to let them know that you have a living will and where they can find it. A living will is used if you become:  Terminally ill.  Disabled.  Unable to communicate or make decisions. Items to consider in your living will include:  To use or not to use life-support equipment, such as dialysis machines and breathing machines (ventilators).  A DNR or DNAR order. This tells health care providers not to use cardiopulmonary resuscitation (CPR) if breathing or heartbeat stops.  To use or not to use tube feeding.  To be given or not to be given food and fluids.  Comfort (palliative) care when the goal becomes comfort rather than a cure.  Donation of organs and tissues. A living will does not give instructions for distributing your money and property if you should pass away. DNR  or DNAR A DNR or DNAR order is a request not to have CPR in the event that your heart stops beating or you stop breathing. If a DNR or DNAR order has not been made and shared, a health care provider will try to help any patient whose heart has stopped or who has stopped breathing. If you plan to have surgery, talk with your health care provider about how your DNR or DNAR order will be followed if problems occur. What if I do not have an advance directive? If you do not have an advance directive,  some states assign family decision makers to act on your behalf based on how closely you are related to them. Each state has its own laws about advance directives. You may want to check with your health care provider, attorney, or state representative about the laws in your state. Summary  Advance directives are the legal documents that allow you to make choices ahead of time about your health care and medical treatment in case you become unable to tell others about your care.  The process of discussing and writing advance directives should happen over time. You can change the advance directives, even after you have signed them.  Advance directives include DNR or DNAR orders, living wills, and designating an agent as your medical power of attorney. This information is not intended to replace advice given to you by your health care provider. Make sure you discuss any questions you have with your health care provider. Document Revised: 08/13/2018 Document Reviewed: 08/13/2018 Elsevier Patient Education  [place].  Influenza Virus Vaccine (Flucelvax) What is this medicine? INFLUENZA VIRUS VACCINE (in floo EN zuh VAHY ruhs vak SEEN) helps to reduce the risk of getting influenza also known as the flu. The vaccine only helps protect you against some strains of the flu. This medicine may be used for other purposes; ask your health care provider or pharmacist if you have questions. COMMON BRAND NAME(S): FLUCELVAX What should I tell my health care provider before I take this medicine? They need to know if you have any of these conditions:  bleeding disorder like hemophilia  fever or infection  Guillain-Barre syndrome or other neurological problems  immune system problems  infection with the human immunodeficiency virus (HIV) or AIDS  low blood platelet counts  multiple sclerosis  an unusual or allergic reaction to influenza virus vaccine, other medicines, foods, dyes or  preservatives  pregnant or trying to get pregnant  breast-feeding How should I use this medicine? This vaccine is for injection into a muscle. It is given by a health care professional. A copy of Vaccine Information Statements will be given before each vaccination. Read this sheet carefully each time. The sheet may change frequently. Talk to your pediatrician regarding the use of this medicine in children. Special care may be needed. Overdosage: If you think you've taken too much of this medicine contact a poison control center or emergency room at once. Overdosage: If you think you have taken too much of this medicine contact a poison control center or emergency room at once. NOTE: This medicine is only for you. Do not share this medicine with others. What if I miss a dose? This does not apply. What may interact with this medicine?  chemotherapy or radiation therapy  medicines that lower your immune system like etanercept, anakinra, infliximab, and adalimumab  medicines that treat or prevent blood clots like warfarin  phenytoin  steroid medicines like prednisone or cortisone  theophylline  vaccines This list may not describe all possible interactions. Give your health care provider a list of all the medicines, herbs, non-prescription drugs, or dietary supplements you use. Also tell them if you smoke, drink alcohol, or use illegal drugs. Some items may interact with your medicine. What should I watch for while using this medicine? Report any side effects that do not go away within 3 days to your doctor or health care professional. Call your health care provider if any unusual symptoms occur within 6 weeks of receiving this vaccine. You may still catch the flu, but the illness is not usually as bad. You cannot get the flu from the vaccine. The vaccine will not protect against colds or other illnesses that may cause fever. The vaccine is needed every year. What side effects may I  notice from receiving this medicine? Side effects that you should report to your doctor or health care professional as soon as possible:  allergic reactions like skin rash, itching or hives, swelling of the face, lips, or tongue Side effects that usually do not require medical attention (Report these to your doctor or health care professional if they continue or are bothersome.):  fever  headache  muscle aches and pains  pain, tenderness, redness, or swelling at the injection site  tiredness This list may not describe all possible side effects. Call your doctor for medical advice about side effects. You may report side effects to FDA at 1-800-FDA-1088. Where should I keep my medicine? The vaccine will be given by a health care professional in a clinic, pharmacy, doctor's office, or other health care setting. You will not be given vaccine doses to store at home. NOTE: This sheet is a summary. It may not cover all possible information. If you have questions about this medicine, talk to your doctor, pharmacist, or health care provider.  2020 Elsevier/Gold Standard (2010-12-26 14:06:47)  Preventive Care 65 Years and Older, Female Preventive care refers to lifestyle choices and visits with your health care provider that can promote health and wellness. This includes:  A yearly physical exam. This is also called an annual well check.  Regular dental and eye exams.  Immunizations.  Screening for certain conditions.  Healthy lifestyle choices, such as diet and exercise. What can I expect for my preventive care visit? Physical exam Your health care provider will check:  Height and weight. These may be used to calculate body mass index (BMI), which is a measurement that tells if you are at a healthy weight.  Heart rate and blood pressure.  Your skin for abnormal spots. Counseling Your health care provider may ask you questions about:  Alcohol, tobacco, and drug use.  Emotional  well-being.  Home and relationship well-being.  Sexual activity.  Eating habits.  History of falls.  Memory and ability to understand (cognition).  Work and work Statistician.  Pregnancy and menstrual history. What immunizations do I need?  Influenza (flu) vaccine  This is recommended every year. Tetanus, diphtheria, and pertussis (Tdap) vaccine  You may need a Td booster every 10 years. Varicella (chickenpox) vaccine  You may need this vaccine if you have not already been vaccinated. Zoster (shingles) vaccine  You may need this after age 9. Pneumococcal conjugate (PCV13) vaccine  One dose is recommended after age 32. Pneumococcal polysaccharide (PPSV23) vaccine  One dose is recommended after age 49. Measles, mumps, and rubella (MMR) vaccine  You may need at least one dose of MMR if you were born in  1956 or later. You may also need a second dose. Meningococcal conjugate (MenACWY) vaccine  You may need this if you have certain conditions. Hepatitis A vaccine  You may need this if you have certain conditions or if you travel or work in places where you may be exposed to hepatitis A. Hepatitis B vaccine  You may need this if you have certain conditions or if you travel or work in places where you may be exposed to hepatitis B. Haemophilus influenzae type b (Hib) vaccine  You may need this if you have certain conditions. You may receive vaccines as individual doses or as more than one vaccine together in one shot (combination vaccines). Talk with your health care provider about the risks and benefits of combination vaccines. What tests do I need? Blood tests  Lipid and cholesterol levels. These may be checked every 5 years, or more frequently depending on your overall health.  Hepatitis C test.  Hepatitis B test. Screening  Lung cancer screening. You may have this screening every year starting at age 66 if you have a 30-pack-year history of smoking and  currently smoke or have quit within the past 15 years.  Colorectal cancer screening. All adults should have this screening starting at age 66 and continuing until age 41. Your health care provider may recommend screening at age 83 if you are at increased risk. You will have tests every 1-10 years, depending on your results and the type of screening test.  Diabetes screening. This is done by checking your blood sugar (glucose) after you have not eaten for a while (fasting). You may have this done every 1-3 years.  Mammogram. This may be done every 1-2 years. Talk with your health care provider about how often you should have regular mammograms.  BRCA-related cancer screening. This may be done if you have a family history of breast, ovarian, tubal, or peritoneal cancers. Other tests  Sexually transmitted disease (STD) testing.  Bone density scan. This is done to screen for osteoporosis. You may have this done starting at age 34. Follow these instructions at home: Eating and drinking  Eat a diet that includes fresh fruits and vegetables, whole grains, lean protein, and low-fat dairy products. Limit your intake of foods with high amounts of sugar, saturated fats, and salt.  Take vitamin and mineral supplements as recommended by your health care provider.  Do not drink alcohol if your health care provider tells you not to drink.  If you drink alcohol: ? Limit how much you have to 0-1 drink a day. ? Be aware of how much alcohol is in your drink. In the [place] one drink equals one 12 oz bottle of beer (355 mL), one 5 oz glass of wine (148 mL), or one 1 oz glass of hard liquor (44 mL). Lifestyle  Take daily care of your teeth and gums.  Stay active. Exercise for at least 30 minutes on 5 or more days each week.  Do not use any products that contain nicotine or tobacco, such as cigarettes, e-cigarettes, and chewing tobacco. If you need help quitting, ask your health care provider.  If you are  sexually active, practice safe sex. Use a condom or other form of protection in order to prevent STIs (sexually transmitted infections).  Talk with your health care provider about taking a low-dose aspirin or statin. What's next?  Go to your health care provider once a year for a well check visit.  Ask your health care provider how  often you should have your eyes and teeth checked.  Stay up to date on all vaccines. This information is not intended to replace advice given to you by your health care provider. Make sure you discuss any questions you have with your health care provider. Document Revised: 01/08/2018 Document Reviewed: 01/08/2018 Elsevier Patient Education  2020 [place] Maintenance, Female Adopting a healthy lifestyle and getting preventive care are important in promoting health and wellness. Ask your health care provider about:  The right schedule for you to have regular tests and exams.  Things you can do on your own to prevent diseases and keep yourself healthy. What should I know about diet, weight, and exercise? Eat a healthy diet   Eat a diet that includes plenty of vegetables, fruits, low-fat dairy products, and lean protein.  Do not eat a lot of foods that are high in solid fats, added sugars, or sodium. Maintain a healthy weight Body mass index (BMI) is used to identify weight problems. It estimates body fat based on height and weight. Your health care provider can help determine your BMI and help you achieve or maintain a healthy weight. Get regular exercise Get regular exercise. This is one of the most important things you can do for your health. Most adults should:  Exercise for at least 150 minutes each week. The exercise should increase your heart rate and make you sweat (moderate-intensity exercise).  Do strengthening exercises at least twice a week. This is in addition to the moderate-intensity exercise.  Spend less time sitting. Even  light physical activity can be beneficial. Watch cholesterol and blood lipids Have your blood tested for lipids and cholesterol at 78 years of age, then have this test every 5 years. Have your cholesterol levels checked more often if:  Your lipid or cholesterol levels are high.  You are older than 78 years of age.  You are at high risk for heart disease. What should I know about cancer screening? Depending on your health history and family history, you may need to have cancer screening at various ages. This may include screening for:  Breast cancer.  Cervical cancer.  Colorectal cancer.  Skin cancer.  Lung cancer. What should I know about heart disease, diabetes, and high blood pressure? Blood pressure and heart disease  High blood pressure causes heart disease and increases the risk of stroke. This is more likely to develop in people who have high blood pressure readings, are of African descent, or are overweight.  Have your blood pressure checked: ? Every 3-5 years if you are 41-67 years of age. ? Every year if you are 24 years old or older. Diabetes Have regular diabetes screenings. This checks your fasting blood sugar level. Have the screening done:  Once every three years after age 56 if you are at a normal weight and have a low risk for diabetes.  More often and at a younger age if you are overweight or have a high risk for diabetes. What should I know about preventing infection? Hepatitis B If you have a higher risk for hepatitis B, you should be screened for this virus. Talk with your health care provider to find out if you are at risk for hepatitis B infection. Hepatitis C Testing is recommended for:  Everyone born from [birth year] through 1964.  Anyone with known risk factors for hepatitis C. Sexually transmitted infections (STIs)  Get screened for STIs, including gonorrhea and chlamydia, if: ? You are sexually active and  are younger than 78 years of age. ? You are  older than 78 years of age and your health care provider tells you that you are at risk for this type of infection. ? Your sexual activity has changed since you were last screened, and you are at increased risk for chlamydia or gonorrhea. Ask your health care provider if you are at risk.  Ask your health care provider about whether you are at high risk for HIV. Your health care provider may recommend a prescription medicine to help prevent HIV infection. If you choose to take medicine to prevent HIV, you should first get tested for HIV. You should then be tested every 3 months for as long as you are taking the medicine. Pregnancy  If you are about to stop having your period (premenopausal) and you may become pregnant, seek counseling before you get pregnant.  Take 400 to 800 micrograms (mcg) of folic acid every day if you become pregnant.  Ask for birth control (contraception) if you want to prevent pregnancy. Osteoporosis and menopause Osteoporosis is a disease in which the bones lose minerals and strength with aging. This can result in bone fractures. If you are 52 years old or older, or if you are at risk for osteoporosis and fractures, ask your health care provider if you should:  Be screened for bone loss.  Take a calcium or vitamin D supplement to lower your risk of fractures.  Be given hormone replacement therapy (HRT) to treat symptoms of menopause. Follow these instructions at home: Lifestyle  Do not use any products that contain nicotine or tobacco, such as cigarettes, e-cigarettes, and chewing tobacco. If you need help quitting, ask your health care provider.  Do not use street drugs.  Do not share needles.  Ask your health care provider for help if you need support or information about quitting drugs. Alcohol use  Do not drink alcohol if: ? Your health care provider tells you not to drink. ? You are pregnant, may be pregnant, or are planning to become pregnant.  If you  drink alcohol: ? Limit how much you use to 0-1 drink a day. ? Limit intake if you are breastfeeding.  Be aware of how much alcohol is in your drink. In the [place] one drink equals one 12 oz bottle of beer (355 mL), one 5 oz glass of wine (148 mL), or one 1 oz glass of hard liquor (44 mL). General instructions  Schedule regular health, dental, and eye exams.  Stay current with your vaccines.  Tell your health care provider if: ? You often feel depressed. ? You have ever been abused or do not feel safe at home. Summary  Adopting a healthy lifestyle and getting preventive care are important in promoting health and wellness.  Follow your health care provider's instructions about healthy diet, exercising, and getting tested or screened for diseases.  Follow your health care provider's instructions on monitoring your cholesterol and blood pressure. This information is not intended to replace advice given to you by your health care provider. Make sure you discuss any questions you have with your health care provider. Document Revised: 01/07/2018 Document Reviewed: 01/07/2018 Elsevier Patient Education  2020 [employer].

## 2019-12-06 DIAGNOSIS — N959 Unspecified menopausal and perimenopausal disorder: Secondary | ICD-10-CM | POA: Diagnosis not present

## 2019-12-06 DIAGNOSIS — Z0389 Encounter for observation for other suspected diseases and conditions ruled out: Secondary | ICD-10-CM | POA: Diagnosis not present

## 2020-01-04 ENCOUNTER — Other Ambulatory Visit: Payer: Self-pay | Admitting: Family Medicine

## 2020-01-25 ENCOUNTER — Encounter: Payer: Self-pay | Admitting: Nurse Practitioner

## 2020-01-25 ENCOUNTER — Telehealth (INDEPENDENT_AMBULATORY_CARE_PROVIDER_SITE_OTHER): Payer: PPO | Admitting: Nurse Practitioner

## 2020-01-25 VITALS — Temp 96.1°F | Ht 64.0 in | Wt 148.0 lb

## 2020-01-25 DIAGNOSIS — J018 Other acute sinusitis: Secondary | ICD-10-CM

## 2020-01-25 MED ORDER — LORATADINE 10 MG PO TABS: 10.0000 mg | ORAL_TABLET | Freq: Every day | ORAL | 0 refills | Status: DC

## 2020-01-25 MED ORDER — FLUTICASONE PROPIONATE 50 MCG/ACT NA SUSP: 2.0000 | Freq: Every day | NASAL | 1 refills | Status: DC

## 2020-01-25 MED ORDER — AMOXICILLIN 875 MG PO TABS: 875.0000 mg | ORAL_TABLET | Freq: Two times a day (BID) | ORAL | 0 refills | Status: DC

## 2020-01-25 NOTE — Progress Notes (Signed)
Virtual Visit via Telephone Note   This visit type was conducted due to national recommendations for restrictions regarding the COVID-19 Pandemic (e.g. social distancing) in an effort to limit this patient's exposure and mitigate transmission in our community.  Due to her co-morbid illnesses, this patient is at least at moderate risk for complications without adequate follow up.  This format is felt to be most appropriate for this patient at this time.  The patient did not have access to video technology/had technical difficulties with video requiring transitioning to audio format only (telephone).  All issues noted in this document were discussed and addressed.  No physical exam could be performed with this format.  Patient verbally consented to a telehealth visit.   Date:  01/25/2020   ID:  Janet Richardson, DOB [birth year], MRN [id number]  Patient Location: Home Provider Location: Office/Clinic  PCP:  Blane Ohara, MD   Evaluation Performed: Established patient, acute telemedicine visit  Chief Complaint: Sinus congestion  History of Present Illness:    Janet Richardson is a 79 y.o. female with sinus congestion, pressure, and pain for 7-days. She tells me she has a non-productive cough that worsens when she lies down in bed. The cough is causing interrupting rest and sleep. She denies fever, chills, body aches, or rash. Denies chest pain or dyspnea. Treatment has included green tea with lemon and Nyquil nasal decongestant. She has received COVID-19 and annual flu vaccines. Past medical history includes hyperlipidemia, hypothyroidism, and anemia.   The patient does have symptoms concerning for COVID-19 infection (fever, chills, cough, or new shortness of breath).    Past Medical History:  Diagnosis Date  . Adjustment insomnia   . Mixed hyperlipidemia   . Osteoarthritis   . Other specified hypothyroidism     Past Surgical History:  Procedure Laterality Date  . CATARACT EXTRACTION  Right   . CATARACT EXTRACTION Left 10/2017  . HIP ARTHROPLASTY Right   . JOINT REPLACEMENT Right    total hip arthroplasty  . THYROIDECTOMY    . TONSILLECTOMY      Family History  Problem Relation Age of Onset  . Heart failure Father   . Cancer Brother        lung  . Cancer Brother        prostate    Social History   Socioeconomic History  . Marital status: Divorced    Spouse name: Not on file  . Number of children: 2  . Years of education: Not on file  . Highest education level: Not on file  Occupational History  . Occupation: Print production planner    CommentManufacturing engineer  Tobacco Use  . Smoking status: Never Smoker  . Smokeless tobacco: Never Used  Substance and Sexual Activity  . Alcohol use: Yes    Alcohol/week: 1.0 standard drink    Types: 1 Glasses of wine per week  . Drug use: Never  . Sexual activity: Not Currently  Other Topics Concern  . Not on file  Social History Narrative  . Not on file   Social Determinants of Health   Financial Resource Strain: Low Risk   . Difficulty of Paying Living Expenses: Not hard at all  Food Insecurity: No Food Insecurity  . Worried About Programme researcher, broadcasting/film/video in the Last Year: Never true  . Ran Out of Food in the Last Year: Never true  Transportation Needs: No Transportation Needs  . Lack of Transportation (Medical): No  . Lack of Transportation (  Non-Medical): No  Physical Activity: Sufficiently Active  . Days of Exercise per Week: 3 days  . Minutes of Exercise per Session: 50 min  Stress: No Stress Concern Present  . Feeling of Stress : Not at all  Social Connections: Moderately Integrated  . Frequency of Communication with Friends and Family: More than three times a week  . Frequency of Social Gatherings with Friends and Family: More than three times a week  . Attends Religious Services: 1 to 4 times per year  . Active Member of Clubs or Organizations: Yes  . Attends Banker Meetings: 1 to 4  times per year  . Marital Status: Divorced  Catering manager Violence: Not At Risk  . Fear of Current or Ex-Partner: No  . Emotionally Abused: No  . Physically Abused: No  . Sexually Abused: No    Outpatient Medications Prior to Visit  Medication Sig Dispense Refill  . Calcium-Vitamin D-Vitamin K (VIACTIV CALCIUM PLUS D PO) Take 650 mg by mouth in the morning and at bedtime.    Marland Kitchen levothyroxine (SYNTHROID) 100 MCG tablet TAKE 1 TABLET BY MOUTH EVERY DAY 90 tablet 2  . Multiple Vitamins-Minerals (PRESERVISION AREDS PO) Take by mouth.    . simvastatin (ZOCOR) 20 MG tablet TAKE 1 TABLET BY MOUTH EVERYDAY AT BEDTIME 90 tablet 1   No facility-administered medications prior to visit.    Allergies:   Codeine   Social History   Tobacco Use  . Smoking status: Never Smoker  . Smokeless tobacco: Never Used  Substance Use Topics  . Alcohol use: Yes    Alcohol/week: 1.0 standard drink    Types: 1 Glasses of wine per week  . Drug use: Never     Review of Systems  Constitutional: Negative for fever and malaise/fatigue.  HENT: Positive for congestion (Head and chest) and sinus pain (pressure). Negative for ear pain and sore throat.   Respiratory: Positive for cough. Negative for sputum production, shortness of breath and wheezing.   Cardiovascular: Negative for chest pain and palpitations.  Gastrointestinal: Negative for abdominal pain, constipation, diarrhea, nausea and vomiting.  Genitourinary: Negative for frequency and urgency.  Musculoskeletal: Negative for back pain, joint pain and myalgias.  Neurological: Positive for headaches.  Endo/Heme/Allergies: Negative for environmental allergies and polydipsia.     Labs/Other Tests and Data Reviewed:    Recent Labs: 05/24/2019: ALT 18; BUN 19; Creatinine, Ser 0.79; Hemoglobin 14.6; Platelets 332; Potassium 4.4; Sodium 141; TSH 3.130   Recent Lipid Panel Lab Results  Component Value Date/Time   CHOL 215 (H) 05/24/2019 07:47 AM   TRIG  81 05/24/2019 07:47 AM   HDL 85 05/24/2019 07:47 AM   CHOLHDL 2.5 05/24/2019 07:47 AM   LDLCALC 116 (H) 05/24/2019 07:47 AM    Wt Readings from Last 3 Encounters:  01/25/20 148 lb (67.1 kg)  11/23/19 149 lb 6.4 oz (67.8 kg)  05/24/19 149 lb 9.6 oz (67.9 kg)     Objective:    Vital Signs:  Temp (!) 96.1 F (35.6 C)   Ht 5\' 4"  (1.626 m)   Wt 148 lb (67.1 kg)   BMI 25.40 kg/m    Physical Exam Vitals reviewed.   Physical exam not performed due to telemedicine visit   ASSESSMENT & PLAN:    1. Acute non-recurrent sinusitis of other sinus - amoxicillin (AMOXIL) 875 MG tablet; Take 1 tablet (875 mg total) by mouth 2 (two) times daily.  Dispense: 14 tablet; Refill: 0 - fluticasone (FLONASE) 50  MCG/ACT nasal spray; Place 2 sprays into both nostrils daily.  Dispense: 16 g; Refill: 1 - loratadine (CLARITIN) 10 MG tablet; Take 1 tablet (10 mg total) by mouth daily.  Dispense: 90 tablet; Refill: 0  Rest and push fluids Notify office if symptoms fail to improve or worsen   COVID-19 Education: The signs and symptoms of COVID-19 were discussed with the patient and how to seek care for testing (follow up with PCP or arrange E-visit). The importance of social distancing was discussed today.  Telephone time: 09:46-09:53  I spent 15 minutes dedicated to the care of this patient on the date of this encounter to include telephone time with the patient, as well as:EMR review and prescription medication management  Follow Up:  Virtual Visit  prn  Signed,  Flonnie Hailstone, DNP  01/25/2020 19:11    [facility]

## 2020-02-16 ENCOUNTER — Other Ambulatory Visit: Payer: Self-pay | Admitting: Nurse Practitioner

## 2020-02-16 DIAGNOSIS — J018 Other acute sinusitis: Secondary | ICD-10-CM

## 2020-04-24 DIAGNOSIS — Z124 Encounter for screening for malignant neoplasm of cervix: Secondary | ICD-10-CM | POA: Diagnosis not present

## 2020-04-24 DIAGNOSIS — Z6825 Body mass index (BMI) 25.0-25.9, adult: Secondary | ICD-10-CM | POA: Diagnosis not present

## 2020-04-24 DIAGNOSIS — Z01419 Encounter for gynecological examination (general) (routine) without abnormal findings: Secondary | ICD-10-CM | POA: Diagnosis not present

## 2020-04-24 DIAGNOSIS — Z1231 Encounter for screening mammogram for malignant neoplasm of breast: Secondary | ICD-10-CM | POA: Diagnosis not present

## 2020-04-24 LAB — HM MAMMOGRAPHY

## 2020-06-09 ENCOUNTER — Other Ambulatory Visit: Payer: Self-pay | Admitting: Family Medicine

## 2020-07-27 ENCOUNTER — Other Ambulatory Visit: Payer: Self-pay | Admitting: Family Medicine

## 2020-08-15 ENCOUNTER — Other Ambulatory Visit: Payer: Self-pay | Admitting: Family Medicine

## 2020-08-18 ENCOUNTER — Other Ambulatory Visit: Payer: Self-pay

## 2020-08-18 ENCOUNTER — Ambulatory Visit (INDEPENDENT_AMBULATORY_CARE_PROVIDER_SITE_OTHER): Payer: PPO | Admitting: Legal Medicine

## 2020-08-18 ENCOUNTER — Encounter: Payer: Self-pay | Admitting: Legal Medicine

## 2020-08-18 VITALS — BP 118/70 | HR 89 | Temp 97.7°F | Resp 15 | Ht 64.0 in | Wt 148.0 lb

## 2020-08-18 DIAGNOSIS — I824Z2 Acute embolism and thrombosis of unspecified deep veins of left distal lower extremity: Secondary | ICD-10-CM | POA: Diagnosis not present

## 2020-08-18 DIAGNOSIS — M79662 Pain in left lower leg: Secondary | ICD-10-CM | POA: Diagnosis not present

## 2020-08-18 NOTE — Progress Notes (Signed)
Established Patient Office Visit  Subjective:  Patient ID: Janet Richardson, female    DOB: 01-May-1940  Age: 80 y.o. MRN: [id number]  CC:  Chief Complaint  Patient presents with   Leg Pain    Since 2 days ago, patient noticed dull aching pain on her left lower leg. No trauma.    HPI Janet Richardson presents for pain lower leg.walks 3 times a week1.5 mile.  3 days of left lower leg pain at night.  No trauma.  Past Medical History:  Diagnosis Date   Adjustment insomnia    Mixed hyperlipidemia    Osteoarthritis    Other specified hypothyroidism     Past Surgical History:  Procedure Laterality Date   CATARACT EXTRACTION Right    CATARACT EXTRACTION Left 10/2017   HIP ARTHROPLASTY Right    JOINT REPLACEMENT Right    total hip arthroplasty   THYROIDECTOMY     TONSILLECTOMY      Family History  Problem Relation Age of Onset   Heart failure Father    Cancer Brother        lung   Cancer Brother        prostate    Social History   Socioeconomic History   Marital status: Divorced    Spouse name: Not on file   Number of children: 2   Years of education: Not on file   Highest education level: Not on file  Occupational History   Occupation: Print production planner    Comment: [employer]  Tobacco Use   Smoking status: Never   Smokeless tobacco: Never  Substance and Sexual Activity   Alcohol use: Yes    Alcohol/week: 1.0 standard drink    Types: 1 Glasses of wine per week   Drug use: Never   Sexual activity: Not Currently  Other Topics Concern   Not on file  Social History Narrative   Not on file   Social Determinants of Health   Financial Resource Strain: Low Risk    Difficulty of Paying Living Expenses: Not hard at all  Food Insecurity: No Food Insecurity   Worried About Programme researcher, broadcasting/film/video in the Last Year: Never true   Ran Out of Food in the Last Year: Never true  Transportation Needs: No Transportation Needs   Lack of Transportation (Medical): No    Lack of Transportation (Non-Medical): No  Physical Activity: Sufficiently Active   Days of Exercise per Week: 3 days   Minutes of Exercise per Session: 50 min  Stress: No Stress Concern Present   Feeling of Stress : Not at all  Social Connections: Moderately Integrated   Frequency of Communication with Friends and Family: More than three times a week   Frequency of Social Gatherings with Friends and Family: More than three times a week   Attends Religious Services: 1 to 4 times per year   Active Member of [employer] or Organizations: Yes   Attends Banker Meetings: 1 to 4 times per year   Marital Status: Divorced  Catering manager Violence: Not At Risk   Fear of Current or Ex-Partner: No   Emotionally Abused: No   Physically Abused: No   Sexually Abused: No    Outpatient Medications Prior to Visit  Medication Sig Dispense Refill   Calcium-Vitamin D-Vitamin K (VIACTIV CALCIUM PLUS D PO) Take 650 mg by mouth in the morning and at bedtime.     levothyroxine (SYNTHROID) 100 MCG tablet TAKE 1 TABLET BY MOUTH  EVERY DAY 90 tablet 0   Multiple Vitamins-Minerals (PRESERVISION AREDS PO) Take by mouth.     simvastatin (ZOCOR) 20 MG tablet TAKE 1 TABLET BY MOUTH EVERYDAY AT BEDTIME 90 tablet 0   amoxicillin (AMOXIL) 875 MG tablet Take 1 tablet (875 mg total) by mouth 2 (two) times daily. 14 tablet 0   fluticasone (FLONASE) 50 MCG/ACT nasal spray SPRAY 2 SPRAYS INTO EACH NOSTRIL EVERY DAY 16 mL 1   loratadine (CLARITIN) 10 MG tablet Take 1 tablet (10 mg total) by mouth daily. 90 tablet 0   No facility-administered medications prior to visit.    Allergies  Allergen Reactions   Codeine Hives    ROS Review of Systems  Constitutional:  Negative for activity change and appetite change.  HENT:  Negative for congestion.   Eyes:  Negative for visual disturbance.  Respiratory:  Negative for chest tightness and shortness of breath.   Cardiovascular:  Negative for chest pain,  palpitations and leg swelling.  Gastrointestinal:  Negative for abdominal distention and abdominal pain.  Endocrine: Negative for polyuria.  Genitourinary:  Negative for difficulty urinating and dyspareunia.  Musculoskeletal:  Positive for arthralgias.  Neurological: Negative.   Psychiatric/Behavioral: Negative.       Objective:    Physical Exam Vitals reviewed.  Constitutional:      General: She is not in acute distress.    Appearance: Normal appearance.  HENT:     Head: Normocephalic.     Right Ear: Tympanic membrane, ear canal and external ear normal.     Left Ear: Tympanic membrane, ear canal and external ear normal.     Mouth/Throat:     Mouth: Mucous membranes are moist.  Eyes:     Extraocular Movements: Extraocular movements intact.     Conjunctiva/sclera: Conjunctivae normal.     Pupils: Pupils are equal, round, and reactive to light.  Cardiovascular:     Rate and Rhythm: Normal rate and regular rhythm.     Pulses: Normal pulses.     Heart sounds: Normal heart sounds. No murmur heard.   No gallop.  Pulmonary:     Effort: Pulmonary effort is normal. No respiratory distress.     Breath sounds: Normal breath sounds. No wheezing.  Abdominal:     General: Abdomen is flat. Bowel sounds are normal. There is no distension.     Palpations: Abdomen is soft.     Tenderness: There is no abdominal tenderness.  Musculoskeletal:     Cervical back: Normal range of motion.     Left lower leg: Tenderness present. No swelling, deformity, lacerations or bony tenderness. No edema.       Legs:  Skin:    Capillary Refill: Capillary refill takes less than 2 seconds.  Neurological:     General: No focal deficit present.     Mental Status: She is alert and oriented to person, place, and time. Mental status is at baseline.    BP 118/70   Pulse 89   Temp 97.7 F (36.5 C)   Resp 15   Ht 5\' 4"  (1.626 m)   Wt 148 lb (67.1 kg)   SpO2 94%   BMI 25.40 kg/m  Wt Readings from Last 3  Encounters:  08/18/20 148 lb (67.1 kg)  01/25/20 148 lb (67.1 kg)  11/23/19 149 lb 6.4 oz (67.8 kg)     Health Maintenance Due  Topic Date Due   Hepatitis C Screening  Never done   Zoster Vaccines- Shingrix (1 of 2)  Never done   COVID-19 Vaccine (3 - Moderna risk series) 04/21/2019    There are no preventive care reminders to display for this patient.  Lab Results  Component Value Date   TSH 3.130 05/24/2019   Lab Results  Component Value Date   WBC 8.5 05/24/2019   HGB 14.6 05/24/2019   HCT 44.3 05/24/2019   MCV 98 (H) 05/24/2019   PLT 332 05/24/2019   Lab Results  Component Value Date   NA 141 05/24/2019   K 4.4 05/24/2019   CO2 23 05/24/2019   GLUCOSE 87 05/24/2019   BUN 19 05/24/2019   CREATININE 0.79 05/24/2019   BILITOT 0.9 05/24/2019   ALKPHOS 75 05/24/2019   AST 20 05/24/2019   ALT 18 05/24/2019   PROT 7.1 05/24/2019   ALBUMIN 4.8 (H) 05/24/2019   CALCIUM 9.8 05/24/2019   Lab Results  Component Value Date   CHOL 215 (H) 05/24/2019   Lab Results  Component Value Date   HDL 85 05/24/2019   Lab Results  Component Value Date   LDLCALC 116 (H) 05/24/2019   Lab Results  Component Value Date   TRIG 81 05/24/2019   Lab Results  Component Value Date   CHOLHDL 2.5 05/24/2019   No results found for: HGBA1C    Assessment & Plan:   Problem List Items Addressed This Visit   None Visit Diagnoses     Acute deep vein thrombosis (DVT) of distal vein of left lower extremity (HCC)    -  Primary   Relevant Orders   VAS [place] LOWER EXTREMITY VENOUS (DVT)   DG Tibia/Fibula Left Stat venous dopplers and x-ray tibia.  Use warm compressors      Venous dopplers and x-ray negative    Follow-up: Return in about 1 year (around 08/18/2021).    Brent Bulla, MD

## 2020-08-28 DIAGNOSIS — M79605 Pain in left leg: Secondary | ICD-10-CM | POA: Diagnosis not present

## 2020-08-29 DIAGNOSIS — S83012A Lateral subluxation of left patella, initial encounter: Secondary | ICD-10-CM | POA: Diagnosis not present

## 2020-08-29 DIAGNOSIS — M7122 Synovial cyst of popliteal space [Baker], left knee: Secondary | ICD-10-CM | POA: Diagnosis not present

## 2020-08-29 DIAGNOSIS — M1712 Unilateral primary osteoarthritis, left knee: Secondary | ICD-10-CM | POA: Diagnosis not present

## 2020-08-29 DIAGNOSIS — S83002D Unspecified subluxation of left patella, subsequent encounter: Secondary | ICD-10-CM | POA: Diagnosis not present

## 2020-08-29 DIAGNOSIS — S83002A Unspecified subluxation of left patella, initial encounter: Secondary | ICD-10-CM | POA: Diagnosis not present

## 2020-08-29 DIAGNOSIS — M25462 Effusion, left knee: Secondary | ICD-10-CM | POA: Diagnosis not present

## 2020-09-05 DIAGNOSIS — M79605 Pain in left leg: Secondary | ICD-10-CM | POA: Diagnosis not present

## 2020-09-05 DIAGNOSIS — M170 Bilateral primary osteoarthritis of knee: Secondary | ICD-10-CM | POA: Diagnosis not present

## 2020-09-07 DIAGNOSIS — M25562 Pain in left knee: Secondary | ICD-10-CM | POA: Diagnosis not present

## 2020-09-07 DIAGNOSIS — M1712 Unilateral primary osteoarthritis, left knee: Secondary | ICD-10-CM | POA: Diagnosis not present

## 2020-09-15 ENCOUNTER — Other Ambulatory Visit: Payer: Self-pay | Admitting: Family Medicine

## 2020-09-15 NOTE — Telephone Encounter (Signed)
Refill sent to pharmacy.   

## 2020-09-25 ENCOUNTER — Telehealth: Payer: Self-pay

## 2020-09-25 NOTE — Telephone Encounter (Signed)
Called pt to schedule telephone AWV. Left VM to call back.  KP

## 2020-09-26 NOTE — Telephone Encounter (Signed)
error 

## 2020-10-05 DIAGNOSIS — M25562 Pain in left knee: Secondary | ICD-10-CM | POA: Diagnosis not present

## 2020-10-05 DIAGNOSIS — M1712 Unilateral primary osteoarthritis, left knee: Secondary | ICD-10-CM | POA: Diagnosis not present

## 2020-11-12 ENCOUNTER — Other Ambulatory Visit: Payer: Self-pay | Admitting: Family Medicine

## 2020-11-13 NOTE — Telephone Encounter (Signed)
Refill sent to pharmacy.   

## 2020-12-01 ENCOUNTER — Ambulatory Visit (INDEPENDENT_AMBULATORY_CARE_PROVIDER_SITE_OTHER): Payer: PPO

## 2020-12-01 ENCOUNTER — Other Ambulatory Visit: Payer: Self-pay

## 2020-12-01 DIAGNOSIS — Z23 Encounter for immunization: Secondary | ICD-10-CM

## 2020-12-13 ENCOUNTER — Other Ambulatory Visit: Payer: Self-pay | Admitting: Legal Medicine

## 2021-01-02 ENCOUNTER — Ambulatory Visit (INDEPENDENT_AMBULATORY_CARE_PROVIDER_SITE_OTHER): Payer: PPO | Admitting: Family Medicine

## 2021-01-02 ENCOUNTER — Ambulatory Visit (INDEPENDENT_AMBULATORY_CARE_PROVIDER_SITE_OTHER): Payer: PPO

## 2021-01-02 ENCOUNTER — Encounter: Payer: Self-pay | Admitting: Family Medicine

## 2021-01-02 ENCOUNTER — Other Ambulatory Visit: Payer: Self-pay

## 2021-01-02 VITALS — BP 110/60 | HR 60 | Temp 98.3°F | Resp 16 | Wt 146.6 lb

## 2021-01-02 DIAGNOSIS — N3000 Acute cystitis without hematuria: Secondary | ICD-10-CM | POA: Insufficient documentation

## 2021-01-02 DIAGNOSIS — Z23 Encounter for immunization: Secondary | ICD-10-CM

## 2021-01-02 LAB — POCT URINALYSIS DIPSTICK
Bilirubin, UA: NEGATIVE
Blood, UA: NEGATIVE
Glucose, UA: NEGATIVE
Ketones, UA: NEGATIVE
Nitrite, UA: NEGATIVE
Protein, UA: POSITIVE — AB
Spec Grav, UA: 1.02 (ref 1.010–1.025)
Urobilinogen, UA: 0.2 E.U./dL
pH, UA: 6 (ref 5.0–8.0)

## 2021-01-02 MED ORDER — NITROFURANTOIN MONOHYD MACRO 100 MG PO CAPS: 100.0000 mg | ORAL_CAPSULE | Freq: Two times a day (BID) | ORAL | 0 refills | Status: DC

## 2021-01-02 NOTE — Progress Notes (Signed)
Acute Office Visit  Subjective:    Patient ID: Janet Richardson, female    DOB: 1940/07/13, 80 y.o.   MRN: [id number]  Chief Complaint  Patient presents with   Urinary Tract Infection    HPI: Patient is in today for polyuria, bladder pressure.  No dysuria. NO fever, chills.   Past Medical History:  Diagnosis Date   Adjustment insomnia    Mixed hyperlipidemia    Osteoarthritis    Other specified hypothyroidism     Past Surgical History:  Procedure Laterality Date   CATARACT EXTRACTION Right    CATARACT EXTRACTION Left 10/2017   HIP ARTHROPLASTY Right    JOINT REPLACEMENT Right    total hip arthroplasty   THYROIDECTOMY     TONSILLECTOMY      Family History  Problem Relation Age of Onset   Heart failure Father    Cancer Brother        lung   Cancer Brother        prostate    Social History   Socioeconomic History   Marital status: Divorced    Spouse name: Not on file   Number of children: 2   Years of education: Not on file   Highest education level: Not on file  Occupational History   Occupation: Print production planner    Comment: [employer]  Tobacco Use   Smoking status: Never   Smokeless tobacco: Never  Substance and Sexual Activity   Alcohol use: Yes    Alcohol/week: 1.0 standard drink    Types: 1 Glasses of wine per week   Drug use: Never   Sexual activity: Not Currently  Other Topics Concern   Not on file  Social History Narrative   Not on file   Social Determinants of Health   Financial Resource Strain: Not on file  Food Insecurity: Not on file  Transportation Needs: Not on file  Physical Activity: Not on file  Stress: Not on file  Social Connections: Not on file  Intimate Partner Violence: Not on file    Outpatient Medications Prior to Visit  Medication Sig Dispense Refill   Calcium-Vitamin D-Vitamin K (VIACTIV CALCIUM PLUS D PO) Take 650 mg by mouth in the morning and at bedtime.     levothyroxine (SYNTHROID) 100 MCG tablet  TAKE 1 TABLET BY MOUTH EVERY DAY 90 tablet 2   Multiple Vitamins-Minerals (PRESERVISION AREDS PO) Take by mouth.     simvastatin (ZOCOR) 20 MG tablet TAKE 1 TABLET BY MOUTH EVERYDAY AT BEDTIME 90 tablet 0   No facility-administered medications prior to visit.    Allergies  Allergen Reactions   Codeine Hives    Review of Systems  Constitutional:  Negative for chills, fatigue and fever.  HENT:  Negative for congestion, rhinorrhea and sore throat.   Respiratory:  Negative for cough and shortness of breath.   Cardiovascular:  Negative for chest pain.  Gastrointestinal:  Negative for abdominal pain, constipation, diarrhea, nausea and vomiting.  Genitourinary:  Positive for frequency. Negative for dysuria and urgency.  Musculoskeletal:  Negative for back pain and myalgias.  Neurological:  Negative for dizziness, weakness, light-headedness and headaches.  Psychiatric/Behavioral:  Negative for dysphoric mood. The patient is not nervous/anxious.       Objective:    Physical Exam Vitals reviewed.  Constitutional:      Appearance: Normal appearance. She is normal weight.  Cardiovascular:     Rate and Rhythm: Normal rate and regular rhythm.     Heart  sounds: Normal heart sounds.  Pulmonary:     Effort: Pulmonary effort is normal. No respiratory distress.     Breath sounds: Normal breath sounds.  Abdominal:     General: Abdomen is flat. Bowel sounds are normal.     Palpations: Abdomen is soft.     Tenderness: There is no abdominal tenderness.  Neurological:     Mental Status: She is alert and oriented to person, place, and time.  Psychiatric:        Mood and Affect: Mood normal.        Behavior: Behavior normal.    BP 110/60   Pulse 60   Temp 98.3 F (36.8 C)   Resp 16   Wt 146 lb 9.6 oz (66.5 kg)   BMI 25.16 kg/m  Wt Readings from Last 3 Encounters:  01/02/21 146 lb 9.6 oz (66.5 kg)  08/18/20 148 lb (67.1 kg)  01/25/20 148 lb (67.1 kg)    Health Maintenance Due   Topic Date Due   Hepatitis C Screening  Never done   Zoster Vaccines- Shingrix (1 of 2) Never done    There are no preventive care reminders to display for this patient.   Lab Results  Component Value Date   TSH 3.130 05/24/2019   Lab Results  Component Value Date   WBC 8.5 05/24/2019   HGB 14.6 05/24/2019   HCT 44.3 05/24/2019   MCV 98 (H) 05/24/2019   PLT 332 05/24/2019   Lab Results  Component Value Date   NA 141 05/24/2019   K 4.4 05/24/2019   CO2 23 05/24/2019   GLUCOSE 87 05/24/2019   BUN 19 05/24/2019   CREATININE 0.79 05/24/2019   BILITOT 0.9 05/24/2019   ALKPHOS 75 05/24/2019   AST 20 05/24/2019   ALT 18 05/24/2019   PROT 7.1 05/24/2019   ALBUMIN 4.8 (H) 05/24/2019   CALCIUM 9.8 05/24/2019   Lab Results  Component Value Date   CHOL 215 (H) 05/24/2019   Lab Results  Component Value Date   HDL 85 05/24/2019   Lab Results  Component Value Date   LDLCALC 116 (H) 05/24/2019   Lab Results  Component Value Date   TRIG 81 05/24/2019   Lab Results  Component Value Date   CHOLHDL 2.5 05/24/2019   No results found for: HGBA1C     Assessment & Plan:   Problem List Items Addressed This Visit       Genitourinary   Acute cystitis without hematuria - Primary    Start on macrobid.  UA and urine culture.       Relevant Orders   POCT urinalysis dipstick (Completed)   Urine Culture   Meds ordered this encounter  Medications   nitrofurantoin, macrocrystal-monohydrate, (MACROBID) 100 MG capsule    Sig: Take 1 capsule (100 mg total) by mouth 2 (two) times daily.    Dispense:  10 capsule    Refill:  0    Orders Placed This Encounter  Procedures   Urine Culture   POCT urinalysis dipstick     Follow-up: No follow-ups on file.  An After Visit Summary was printed and given to the patient.  Rochel Brome, MD [facility] [phone]

## 2021-01-02 NOTE — Assessment & Plan Note (Signed)
Start on macrobid.  UA and urine culture.

## 2021-01-04 LAB — URINE CULTURE

## 2021-02-09 ENCOUNTER — Other Ambulatory Visit: Payer: Self-pay | Admitting: Family Medicine

## 2021-04-30 DIAGNOSIS — Z124 Encounter for screening for malignant neoplasm of cervix: Secondary | ICD-10-CM | POA: Diagnosis not present

## 2021-04-30 DIAGNOSIS — Z6825 Body mass index (BMI) 25.0-25.9, adult: Secondary | ICD-10-CM | POA: Diagnosis not present

## 2021-04-30 DIAGNOSIS — Z1231 Encounter for screening mammogram for malignant neoplasm of breast: Secondary | ICD-10-CM | POA: Diagnosis not present

## 2021-04-30 LAB — HM MAMMOGRAPHY

## 2021-08-09 ENCOUNTER — Other Ambulatory Visit: Payer: Self-pay | Admitting: Family Medicine

## 2021-08-09 NOTE — Telephone Encounter (Signed)
Patient is over due for an appointment, called and left voicemail to let patient know she needs to schedule a fasting appointment before her RX can be sent in.

## 2021-08-10 ENCOUNTER — Encounter: Payer: Self-pay | Admitting: Family Medicine

## 2021-08-10 ENCOUNTER — Ambulatory Visit (INDEPENDENT_AMBULATORY_CARE_PROVIDER_SITE_OTHER): Payer: PPO | Admitting: Family Medicine

## 2021-08-10 VITALS — BP 110/64 | HR 74 | Temp 97.2°F | Resp 16 | Ht 64.0 in | Wt 148.0 lb

## 2021-08-10 DIAGNOSIS — R7301 Impaired fasting glucose: Secondary | ICD-10-CM | POA: Diagnosis not present

## 2021-08-10 DIAGNOSIS — E782 Mixed hyperlipidemia: Secondary | ICD-10-CM | POA: Diagnosis not present

## 2021-08-10 DIAGNOSIS — Z1211 Encounter for screening for malignant neoplasm of colon: Secondary | ICD-10-CM

## 2021-08-10 DIAGNOSIS — E038 Other specified hypothyroidism: Secondary | ICD-10-CM

## 2021-08-10 NOTE — Patient Instructions (Addendum)
Recommend tetanus vaccine (tdap) at pharmacy.

## 2021-08-10 NOTE — Progress Notes (Signed)
Subjective:  Patient ID: Janet Richardson, female    DOB: April 07, 1940  Age: 81 y.o. MRN: [id number]  Chief Complaint  Patient presents with  . Hyperlipidemia    HPI Hyperlipidemia:  Simvastatin 20 mg daily. Eats healthy.  Due for cologuard.  Had mammogram: 04/28/2021 Sees gynecology.   Hypothyroidism: on levothyroxine 100 mcg once daily in am.   Current Outpatient Medications on File Prior to Visit  Medication Sig Dispense Refill  . Calcium-Vitamin D-Vitamin K (VIACTIV CALCIUM PLUS D PO) Take 650 mg by mouth in the morning and at bedtime.    Marland Kitchen levothyroxine (SYNTHROID) 100 MCG tablet TAKE 1 TABLET BY MOUTH EVERY DAY 90 tablet 2  . Multiple Vitamins-Minerals (PRESERVISION AREDS PO) Take by mouth.    . simvastatin (ZOCOR) 20 MG tablet TAKE 1 TABLET BY MOUTH EVERYDAY AT BEDTIME 90 tablet 1   No current facility-administered medications on file prior to visit.   Past Medical History:  Diagnosis Date  . Adjustment insomnia   . Mixed hyperlipidemia   . Osteoarthritis   . Other specified hypothyroidism    Past Surgical History:  Procedure Laterality Date  . CATARACT EXTRACTION Right   . CATARACT EXTRACTION Left 10/2017  . HIP ARTHROPLASTY Right   . JOINT REPLACEMENT Right    total hip arthroplasty  . THYROIDECTOMY    . TONSILLECTOMY      Family History  Problem Relation Age of Onset  . Heart failure Father   . Cancer Brother        lung  . Cancer Brother        prostate   Social History   Socioeconomic History  . Marital status: Divorced    Spouse name: Not on file  . Number of children: 2  . Years of education: Not on file  . Highest education level: Not on file  Occupational History  . Occupation: Print production planner    CommentManufacturing engineer  Tobacco Use  . Smoking status: Never  . Smokeless tobacco: Never  Substance and Sexual Activity  . Alcohol use: Yes    Alcohol/week: 1.0 standard drink of alcohol    Types: 1 Glasses of wine per week  . Drug  use: Never  . Sexual activity: Not Currently  Other Topics Concern  . Not on file  Social History Narrative  . Not on file   Social Determinants of Health   Financial Resource Strain: Low Risk  (11/23/2019)   Overall Financial Resource Strain (CARDIA)   . Difficulty of Paying Living Expenses: Not hard at all  Food Insecurity: No Food Insecurity (11/23/2019)   Hunger Vital Sign   . Worried About Programme researcher, broadcasting/film/video in the Last Year: Never true   . Ran Out of Food in the Last Year: Never true  Transportation Needs: No Transportation Needs (11/23/2019)   PRAPARE - Transportation   . Lack of Transportation (Medical): No   . Lack of Transportation (Non-Medical): No  Physical Activity: Sufficiently Active (11/23/2019)   Exercise Vital Sign   . Days of Exercise per Week: 3 days   . Minutes of Exercise per Session: 50 min  Stress: No Stress Concern Present (11/23/2019)   [employer] of Occupational Health - Occupational Stress Questionnaire   . Feeling of Stress : Not at all  Social Connections: Moderately Integrated (11/23/2019)   Social Connection and Isolation Panel [NHANES]   . Frequency of Communication with Friends and Family: More than three times a week   .  Frequency of Social Gatherings with Friends and Family: More than three times a week   . Attends Religious Services: 1 to 4 times per year   . Active Member of Clubs or Organizations: Yes   . Attends Archivist Meetings: 1 to 4 times per year   . Marital Status: Divorced    Review of Systems  Constitutional:  Negative for chills, fatigue and fever.  HENT:  Negative for congestion, rhinorrhea and sore throat.   Respiratory:  Negative for cough and shortness of breath.   Cardiovascular:  Negative for chest pain.  Gastrointestinal:  Negative for abdominal pain, constipation, diarrhea, nausea and vomiting.  Genitourinary:  Negative for dysuria and urgency.  Musculoskeletal:  Positive for arthralgias  (bilateral knee pain). Negative for back pain and myalgias.  Neurological:  Negative for dizziness, weakness, light-headedness and headaches.  Psychiatric/Behavioral:  Negative for dysphoric mood. The patient is not nervous/anxious.      Objective:  BP 110/64   Pulse 74   Temp (!) 97.2 F (36.2 C)   Resp 16   Ht 5\' 4"  (1.626 m)   Wt 148 lb (67.1 kg)   BMI 25.40 kg/m      08/10/2021    8:14 AM 01/02/2021   10:09 AM 08/18/2020    8:52 AM  BP/Weight  Systolic BP [id number]  Diastolic BP 64 60 70  Wt. (Lbs) 148 146.6 148  BMI 25.4 kg/m2 25.16 kg/m2 25.4 kg/m2    Physical Exam Vitals reviewed.  Constitutional:      Appearance: Normal appearance. She is normal weight.  Neck:     Vascular: No carotid bruit.  Cardiovascular:     Rate and Rhythm: Normal rate and regular rhythm.     Heart sounds: Normal heart sounds.  Pulmonary:     Effort: Pulmonary effort is normal. No respiratory distress.     Breath sounds: Normal breath sounds.  Abdominal:     General: Abdomen is flat. Bowel sounds are normal.     Palpations: Abdomen is soft.     Tenderness: There is no abdominal tenderness.  Neurological:     Mental Status: She is alert and oriented to person, place, and time.  Psychiatric:        Mood and Affect: Mood normal.        Behavior: Behavior normal.    Diabetic Foot Exam - Simple   No data filed      Lab Results  Component Value Date   WBC 8.5 05/24/2019   HGB 14.6 05/24/2019   HCT 44.3 05/24/2019   PLT 332 05/24/2019   GLUCOSE 87 05/24/2019   CHOL 215 (H) 05/24/2019   TRIG 81 05/24/2019   HDL 85 05/24/2019   LDLCALC 116 (H) 05/24/2019   ALT 18 05/24/2019   AST 20 05/24/2019   NA 141 05/24/2019   K 4.4 05/24/2019   CL 101 05/24/2019   CREATININE 0.79 05/24/2019   BUN 19 05/24/2019   CO2 23 05/24/2019   TSH 3.130 05/24/2019      Assessment & Plan:   Problem List Items Addressed This Visit       Endocrine   Other specified hypothyroidism -  Primary   Relevant Orders   TSH     Other   Mixed hyperlipidemia   Relevant Orders   CBC with Differential/Platelet   Comprehensive metabolic panel   Lipid panel   Screen for colon cancer  .  No orders of the defined types were  placed in this encounter.   Orders Placed This Encounter  Procedures  . CBC with Differential/Platelet  . Comprehensive metabolic panel  . Lipid panel  . TSH     Follow-up: Return for awv before 10/27/2021.  An After Visit Summary was printed and given to the patient.  Blane Ohara, MD [facility] [phone]

## 2021-08-11 LAB — COMPREHENSIVE METABOLIC PANEL
ALT: 20 IU/L (ref 0–32)
AST: 19 IU/L (ref 0–40)
Albumin/Globulin Ratio: 2.3 — ABNORMAL HIGH (ref 1.2–2.2)
Albumin: 4.6 g/dL (ref 3.8–4.8)
Alkaline Phosphatase: 62 IU/L (ref 44–121)
BUN/Creatinine Ratio: 25 (ref 12–28)
BUN: 18 mg/dL (ref 8–27)
Bilirubin Total: 1 mg/dL (ref 0.0–1.2)
CO2: 24 mmol/L (ref 20–29)
Calcium: 9.9 mg/dL (ref 8.7–10.3)
Chloride: 103 mmol/L (ref 96–106)
Creatinine, Ser: 0.72 mg/dL (ref 0.57–1.00)
Globulin, Total: 2 g/dL (ref 1.5–4.5)
Glucose: 346 mg/dL — ABNORMAL HIGH (ref 70–99)
Potassium: 4.4 mmol/L (ref 3.5–5.2)
Sodium: 140 mmol/L (ref 134–144)
Total Protein: 6.6 g/dL (ref 6.0–8.5)
eGFR: 85 mL/min/{1.73_m2} (ref >59)

## 2021-08-11 LAB — CBC WITH DIFFERENTIAL/PLATELET
Basophils Absolute: 0.1 10*3/uL (ref 0.0–0.2)
Basos: 1 %
EOS (ABSOLUTE): 0.2 10*3/uL (ref 0.0–0.4)
Eos: 3 %
Hematocrit: 40.8 % (ref 34.0–46.6)
Hemoglobin: 13.7 g/dL (ref 11.1–15.9)
Immature Grans (Abs): 0 10*3/uL (ref 0.0–0.1)
Immature Granulocytes: 0 %
Lymphocytes Absolute: 1.8 10*3/uL (ref 0.7–3.1)
Lymphs: 28 %
MCH: 32.9 pg (ref 26.6–33.0)
MCHC: 33.6 g/dL (ref 31.5–35.7)
MCV: 98 fL — ABNORMAL HIGH (ref 79–97)
Monocytes Absolute: 0.6 10*3/uL (ref 0.1–0.9)
Monocytes: 10 %
Neutrophils Absolute: 3.8 10*3/uL (ref 1.4–7.0)
Neutrophils: 58 %
Platelets: 311 10*3/uL (ref 150–450)
RBC: 4.16 x10E6/uL (ref 3.77–5.28)
RDW: 12.2 % (ref 11.7–15.4)
WBC: 6.5 10*3/uL (ref 3.4–10.8)

## 2021-08-11 LAB — TSH: TSH: 1.44 u[IU]/mL (ref 0.450–4.500)

## 2021-08-11 LAB — LIPID PANEL
Chol/HDL Ratio: 2.3 ratio (ref 0.0–4.4)
Cholesterol, Total: 178 mg/dL (ref 100–199)
HDL: 77 mg/dL (ref >39)
LDL Chol Calc (NIH): 84 mg/dL (ref 0–99)
Triglycerides: 97 mg/dL (ref 0–149)
VLDL Cholesterol Cal: 17 mg/dL (ref 5–40)

## 2021-08-11 LAB — CARDIOVASCULAR RISK ASSESSMENT

## 2021-08-13 MED ORDER — LEVOTHYROXINE SODIUM 100 MCG PO TABS: 100.0000 ug | ORAL_TABLET | Freq: Every day | ORAL | 3 refills | Status: DC

## 2021-08-13 MED ORDER — SIMVASTATIN 20 MG PO TABS: ORAL_TABLET | ORAL | 3 refills | Status: DC

## 2021-08-13 NOTE — Assessment & Plan Note (Signed)
Well controlled.  No changes to medicines. Continue simvastatin 20 mg daily.  Continue to work on eating a healthy diet and exercise.  Labs drawn today.

## 2021-08-13 NOTE — Assessment & Plan Note (Signed)
Previously well controlled Continue Synthroid at current dose  Recheck TSH and adjust Synthroid as indicated   

## 2021-08-14 LAB — SPECIMEN STATUS REPORT

## 2021-08-14 LAB — HGB A1C W/O EAG: Hgb A1c MFr Bld: 5.2 % (ref 4.8–5.6)

## 2021-08-15 NOTE — Progress Notes (Signed)
HBA1C: 5.2. great.

## 2021-08-16 ENCOUNTER — Ambulatory Visit (INDEPENDENT_AMBULATORY_CARE_PROVIDER_SITE_OTHER): Payer: PPO | Admitting: Family Medicine

## 2021-08-16 VITALS — BP 110/74 | HR 78 | Temp 97.2°F | Resp 16 | Ht 64.0 in | Wt 147.0 lb

## 2021-08-16 DIAGNOSIS — R7309 Other abnormal glucose: Secondary | ICD-10-CM

## 2021-08-16 NOTE — Progress Notes (Unsigned)
Subjective:  Patient ID: Janet Richardson, female    DOB: 1940/03/09  Age: 81 y.o. MRN: [id number]  Chief Complaint  Patient presents with   New onset DM    HPI Patient was seen on 08/10/2021 and her glucose was 346 mg/dl. She came to discuss treatment. However, her A1C was 5.2%. She mentioned that she ate many peaches before her blood sugar was drawn. Current Outpatient Medications on File Prior to Visit  Medication Sig Dispense Refill   Calcium-Vitamin D-Vitamin K (VIACTIV CALCIUM PLUS D PO) Take 650 mg by mouth in the morning and at bedtime.     levothyroxine (SYNTHROID) 100 MCG tablet Take 1 tablet (100 mcg total) by mouth daily. 90 tablet 3   Multiple Vitamins-Minerals (PRESERVISION AREDS PO) Take by mouth.     simvastatin (ZOCOR) 20 MG tablet TAKE 1 TABLET BY MOUTH EVERYDAY AT BEDTIME 90 tablet 3   No current facility-administered medications on file prior to visit.   Past Medical History:  Diagnosis Date   Adjustment insomnia    Mixed hyperlipidemia    Osteoarthritis    Other specified hypothyroidism    Past Surgical History:  Procedure Laterality Date   CATARACT EXTRACTION Right    CATARACT EXTRACTION Left 10/2017   HIP ARTHROPLASTY Right    JOINT REPLACEMENT Right    total hip arthroplasty   THYROIDECTOMY     TONSILLECTOMY      Family History  Problem Relation Age of Onset   Heart failure Father    Cancer Brother        lung   Cancer Brother        prostate   Social History   Socioeconomic History   Marital status: Divorced    Spouse name: Not on file   Number of children: 2   Years of education: Not on file   Highest education level: Not on file  Occupational History   Occupation: Print production planner    Comment: [employer]  Tobacco Use   Smoking status: Never   Smokeless tobacco: Never  Substance and Sexual Activity   Alcohol use: Yes    Alcohol/week: 1.0 standard drink of alcohol    Types: 1 Glasses of wine per week   Drug use: Never    Sexual activity: Not Currently  Other Topics Concern   Not on file  Social History Narrative   Not on file   Social Determinants of Health   Financial Resource Strain: Low Risk  (11/23/2019)   Overall Financial Resource Strain (CARDIA)    Difficulty of Paying Living Expenses: Not hard at all  Food Insecurity: No Food Insecurity (11/23/2019)   Hunger Vital Sign    Worried About Running Out of Food in the Last Year: Never true    Ran Out of Food in the Last Year: Never true  Transportation Needs: No Transportation Needs (11/23/2019)   PRAPARE - Administrator, Civil Service (Medical): No    Lack of Transportation (Non-Medical): No  Physical Activity: Sufficiently Active (11/23/2019)   Exercise Vital Sign    Days of Exercise per Week: 3 days    Minutes of Exercise per Session: 50 min  Stress: No Stress Concern Present (11/23/2019)   [employer] of Occupational Health - Occupational Stress Questionnaire    Feeling of Stress : Not at all  Social Connections: Moderately Integrated (11/23/2019)   Social Connection and Isolation Panel [NHANES]    Frequency of Communication with Friends and Family: More than  three times a week    Frequency of Social Gatherings with Friends and Family: More than three times a week    Attends Religious Services: 1 to 4 times per year    Active Member of [employer] or Organizations: Yes    Attends Banker Meetings: 1 to 4 times per year    Marital Status: Divorced    Review of Systems  Constitutional:  Negative for appetite change, fatigue and fever.  HENT:  Negative for congestion, ear pain, sinus pressure and sore throat.   Respiratory:  Negative for cough, chest tightness, shortness of breath and wheezing.   Cardiovascular:  Negative for chest pain and palpitations.  Gastrointestinal:  Negative for abdominal pain, constipation, diarrhea, nausea and vomiting.  Genitourinary:  Negative for dysuria and hematuria.   Musculoskeletal:  Negative for arthralgias, back pain, joint swelling and myalgias.  Skin:  Negative for rash.  Neurological:  Negative for dizziness, weakness and headaches.  Psychiatric/Behavioral:  Negative for dysphoric mood. The patient is not nervous/anxious.      Objective:  BP 110/74   Pulse 78   Temp (!) 97.2 F (36.2 C)   Resp 16   Ht 5\' 4"  (1.626 m)   Wt 147 lb (66.7 kg)   BMI 25.23 kg/m      08/16/2021    2:06 PM 08/10/2021    8:14 AM 01/02/2021   10:09 AM  BP/Weight  Systolic BP 110 110 110  Diastolic BP 74 64 60  Wt. (Lbs) 147 148 146.6  BMI 25.23 kg/m2 25.4 kg/m2 25.16 kg/m2    Physical Exam Vitals reviewed.  Constitutional:      Appearance: Normal appearance. She is normal weight.  Cardiovascular:     Rate and Rhythm: Normal rate and regular rhythm.     Heart sounds: Normal heart sounds.  Pulmonary:     Effort: Pulmonary effort is normal.     Breath sounds: Normal breath sounds.  Abdominal:     General: Abdomen is flat. Bowel sounds are normal.     Palpations: Abdomen is soft.  Neurological:     Mental Status: She is alert and oriented to person, place, and time.  Psychiatric:        Mood and Affect: Mood normal.        Behavior: Behavior normal.     Diabetic Foot Exam - Simple   No data filed      Lab Results  Component Value Date   WBC 6.5 08/10/2021   HGB 13.7 08/10/2021   HCT 40.8 08/10/2021   PLT 311 08/10/2021   GLUCOSE 77 08/16/2021   CHOL 178 08/10/2021   TRIG 97 08/10/2021   HDL 77 08/10/2021   LDLCALC 84 08/10/2021   ALT 20 08/16/2021   AST 19 08/16/2021   NA 143 08/16/2021   K 4.3 08/16/2021   CL 104 08/16/2021   CREATININE 0.80 08/16/2021   BUN 23 08/16/2021   CO2 22 08/16/2021   TSH 1.440 08/10/2021   HGBA1C 5.5 08/16/2021      Assessment & Plan:   Problem List Items Addressed This Visit       Other   Elevated glucose level - Primary    Recommend continue to work on eating healthy diet and  exercise. Repeat labs.      Relevant Orders   Comprehensive metabolic panel (Completed)   Hemoglobin A1c (Completed)  .  No orders of the defined types were placed in this encounter.   Orders Placed  This Encounter  Procedures   Comprehensive metabolic panel   Hemoglobin A1c    I,Marla I Leal-Borjas,acting as a scribe for Blane Ohara, MD.,have documented all relevant documentation on the behalf of Blane Ohara, MD,as directed by  Blane Ohara, MD while in the presence of Blane Ohara, MD.   Follow-up: No follow-ups on file.  An After Visit Summary was printed and given to the patient.  Blane Ohara, MD [facility] [phone]

## 2021-08-17 DIAGNOSIS — R7309 Other abnormal glucose: Secondary | ICD-10-CM | POA: Insufficient documentation

## 2021-08-17 LAB — HEMOGLOBIN A1C
Est. average glucose Bld gHb Est-mCnc: 111 mg/dL
Hgb A1c MFr Bld: 5.5 % (ref 4.8–5.6)

## 2021-08-17 LAB — COMPREHENSIVE METABOLIC PANEL
ALT: 20 IU/L (ref 0–32)
AST: 19 IU/L (ref 0–40)
Albumin/Globulin Ratio: 2.5 — ABNORMAL HIGH (ref 1.2–2.2)
Albumin: 4.9 g/dL — ABNORMAL HIGH (ref 3.8–4.8)
Alkaline Phosphatase: 76 IU/L (ref 44–121)
BUN/Creatinine Ratio: 29 — ABNORMAL HIGH (ref 12–28)
BUN: 23 mg/dL (ref 8–27)
Bilirubin Total: 0.8 mg/dL (ref 0.0–1.2)
CO2: 22 mmol/L (ref 20–29)
Calcium: 9.7 mg/dL (ref 8.7–10.3)
Chloride: 104 mmol/L (ref 96–106)
Creatinine, Ser: 0.8 mg/dL (ref 0.57–1.00)
Globulin, Total: 2 g/dL (ref 1.5–4.5)
Glucose: 77 mg/dL (ref 70–99)
Potassium: 4.3 mmol/L (ref 3.5–5.2)
Sodium: 143 mmol/L (ref 134–144)
Total Protein: 6.9 g/dL (ref 6.0–8.5)
eGFR: 75 mL/min/{1.73_m2} (ref >59)

## 2021-08-17 NOTE — Assessment & Plan Note (Addendum)
Recommend continue to work on eating healthy diet and exercise. Repeat labs.

## 2021-08-19 ENCOUNTER — Encounter: Payer: Self-pay | Admitting: Family Medicine

## 2021-09-12 ENCOUNTER — Other Ambulatory Visit: Payer: Self-pay

## 2021-09-12 ENCOUNTER — Ambulatory Visit (INDEPENDENT_AMBULATORY_CARE_PROVIDER_SITE_OTHER): Payer: PPO

## 2021-09-12 VITALS — BP 108/72 | HR 82 | Resp 16 | Ht 64.0 in | Wt 149.4 lb

## 2021-09-12 DIAGNOSIS — Z0000 Encounter for general adult medical examination without abnormal findings: Secondary | ICD-10-CM | POA: Diagnosis not present

## 2021-09-12 DIAGNOSIS — Z6825 Body mass index (BMI) 25.0-25.9, adult: Secondary | ICD-10-CM | POA: Diagnosis not present

## 2021-09-12 NOTE — Progress Notes (Signed)
Subjective:   Janet Richardson is a 81 y.o. female who presents for Medicare Annual (Subsequent) preventive examination.  This wellness visit is conducted by a nurse.  The patient's medications were reviewed and reconciled since the patient's last visit.  History details were provided by the patient.  The history appears to be reliable.    Medical History: Patient history and Family history was reviewed  Medications, Allergies, and preventative health maintenance was reviewed and updated.   Cardiac Risk Factors include: advanced age (>14men, >84 women)     Objective:    Today's Vitals   09/12/21 0955  BP: 108/72  Pulse: 82  Resp: 16  Weight: 149 lb 6.4 oz (67.8 kg)  Height: 5\' 4"  (1.626 m)  PainSc: 0-No pain   Body mass index is 25.64 kg/m.     09/12/2021   10:19 AM 11/23/2019    9:39 AM 11/23/2019    9:03 AM  Advanced Directives  Does Patient Have a Medical Advance Directive? Yes No Yes  Type of Advance Directive Living will    Does patient want to make changes to medical advance directive? No - Patient declined  Yes (Inpatient - patient defers changing a medical advance directive and declines information at this time)    Current Medications (verified) Outpatient Encounter Medications as of 09/12/2021  Medication Sig   Calcium-Vitamin D-Vitamin K (VIACTIV CALCIUM PLUS D PO) Take 650 mg by mouth in the morning and at bedtime.   levothyroxine (SYNTHROID) 100 MCG tablet Take 1 tablet (100 mcg total) by mouth daily.   Multiple Vitamins-Minerals (PRESERVISION AREDS PO) Take by mouth.   simvastatin (ZOCOR) 20 MG tablet TAKE 1 TABLET BY MOUTH EVERYDAY AT BEDTIME   [DISCONTINUED] fluticasone (FLONASE) 50 MCG/ACT nasal spray SPRAY 2 SPRAYS INTO EACH NOSTRIL EVERY DAY (Patient not taking: Reported on 09/12/2021)   [DISCONTINUED] ibuprofen (ADVIL) 600 MG tablet Take 1 tablet by mouth every 6 (six) hours as needed. (Patient not taking: Reported on 09/12/2021)   [DISCONTINUED]  Loratadine 10 MG CAPS Take by oral route. (Patient not taking: Reported on 09/12/2021)   No facility-administered encounter medications on file as of 09/12/2021.    Allergies (verified) Codeine   History: Past Medical History:  Diagnosis Date   Adjustment insomnia    Mixed hyperlipidemia    Osteoarthritis    Other specified hypothyroidism    Past Surgical History:  Procedure Laterality Date   CATARACT EXTRACTION Right    CATARACT EXTRACTION Left 10/2017   HIP ARTHROPLASTY Right    JOINT REPLACEMENT Right    total hip arthroplasty   THYROIDECTOMY     TONSILLECTOMY     Family History  Problem Relation Age of Onset   Heart failure Father    Cancer Brother        lung   Cancer Brother        prostate   Social History   Socioeconomic History   Marital status: Divorced   Number of children: 2   Years of education: Not on file   Highest education level: Not on file  Occupational History   Occupation: 11/2017    Comment: [employer]  Tobacco Use   Smoking status: Never   Smokeless tobacco: Never  Vaping Use   Vaping Use: Never used  Substance and Sexual Activity   Alcohol use: Yes    Alcohol/week: 1.0 standard drink of alcohol    Types: 1 Glasses of wine per week   Drug use: Never  Sexual activity: Not Currently   Social Determinants of Health   Financial Resource Strain: Low Risk  (11/23/2019)   Overall Financial Resource Strain (CARDIA)    Difficulty of Paying Living Expenses: Not hard at all  Food Insecurity: No Food Insecurity (11/23/2019)   Hunger Vital Sign    Worried About Running Out of Food in the Last Year: Never true    Ran Out of Food in the Last Year: Never true  Transportation Needs: No Transportation Needs (09/12/2021)   PRAPARE - Administrator, Civil Service (Medical): No    Lack of Transportation (Non-Medical): No  Physical Activity: Sufficiently Active (09/12/2021)   Exercise Vital Sign    Days of Exercise  per Week: 3 days    Minutes of Exercise per Session: 50 min  Stress: No Stress Concern Present (11/23/2019)   [employer] of Occupational Health - Occupational Stress Questionnaire    Feeling of Stress : Not at all  Social Connections: Moderately Integrated (11/23/2019)   Social Connection and Isolation Panel [NHANES]    Frequency of Communication with Friends and Family: More than three times a week    Frequency of Social Gatherings with Friends and Family: More than three times a week    Attends Religious Services: 1 to 4 times per year    Active Member of [employer] or Organizations: Yes    Attends Banker Meetings: 1 to 4 times per year    Marital Status: Divorced    Tobacco Counseling Counseling given: Patient does not use tobacco products   Clinical Intake:  Pre-visit preparation completed: Yes Pain : No/denies pain Pain Score: 0-No pain   BMI - recorded: 25.64 Nutritional Status: BMI 25 -29 Overweight Nutritional Risks: None Diabetes: No How often do you need to have someone help you when you read instructions, pamphlets, or other written materials from your doctor or pharmacy?: 1 - Never Interpreter Needed?: No     Activities of Daily Living    09/12/2021   10:20 AM  In your present state of health, do you have any difficulty performing the following activities:  Hearing? 0  Vision? 0  Difficulty concentrating or making decisions? 0  Walking or climbing stairs? 0  Dressing or bathing? 0  Doing errands, shopping? 0  Preparing Food and eating ? N  Using the Toilet? N  In the past six months, have you accidently leaked urine? N  Do you have problems with loss of bowel control? N  Managing your Medications? N  Managing your Finances? N  Housekeeping or managing your Housekeeping? N    Patient Care Team: Blane Ohara, MD as PCP - General (Family Medicine) Annamaria Helling, MD as Consulting Physician (Obstetrics and Gynecology)     Assessment:    This is a routine wellness examination for Janet Richardson.  Hearing/Vision screen No results found.  Dietary issues and exercise activities discussed: Current Exercise Habits: Home exercise routine, Type of exercise: walking;strength training/weights, Time (Minutes): 30, Frequency (Times/Week): 3, Weekly Exercise (Minutes/Week): 90, Intensity: Moderate, Exercise limited by: None identified   Depression Screen    09/12/2021   10:17 AM 08/10/2021    8:20 AM 01/02/2021   10:12 AM 11/23/2019    9:01 AM 05/24/2019    7:43 AM  PHQ 2/9 Scores  PHQ - 2 Score 0 0 0 0 0    Fall Risk    09/12/2021   10:19 AM 08/10/2021    8:20 AM 01/02/2021   10:12 AM 11/23/2019  9:02 AM 05/24/2019    7:42 AM  Fall Risk   Falls in the past year? 0 0 0 0 1  Number falls in past yr: 0 0 0 0 0  Injury with Fall? 0 0 0 0 1  Risk for fall due to : No Fall Risks No Fall Risks  No Fall Risks   Follow up Falls evaluation completed;Education provided Falls evaluation completed Falls evaluation completed  Falls prevention discussed;Falls evaluation completed    FALL RISK PREVENTION PERTAINING TO THE HOME:  Any stairs in or around the home? No  If so, are there any without handrails? No  Home free of loose throw rugs in walkways, pet beds, electrical cords, etc? Yes  Adequate lighting in your home to reduce risk of falls? Yes   ASSISTIVE DEVICES UTILIZED TO PREVENT FALLS:  Life alert? No  Use of a cane, walker or w/c? No  Grab bars in the bathroom? No  Shower chair or bench in shower? Yes  Elevated toilet seat or a handicapped toilet? No   Gait steady and fast without use of assistive device  Cognitive Function:        09/12/2021   10:21 AM 11/23/2019    9:30 AM  6CIT Screen  What Year? 0 points 0 points  What month? 0 points 0 points  What time? 0 points 0 points  Count back from 20 0 points 0 points  Months in reverse 0 points 0 points  Repeat phrase 0 points 0 points  Total Score 0 points 0 points     Immunizations Immunization History  Administered Date(s) Administered   Fluad Quad(high Dose 65+) 11/23/2019, 12/01/2020   Influenza-Unspecified 01/28/2013, 11/05/2018   Moderna Covid-19 Vaccine Bivalent Booster 85yrs & up 01/02/2021   Moderna Sars-Covid-2 Vaccination 02/26/2019, 03/24/2019   Pneumococcal Conjugate-13 05/20/2013   Pneumococcal Polysaccharide-23 10/22/2010   Pneumococcal-Unspecified 02/28/2010   Tdap 02/20/2011   Zoster, Live 03/29/2010    TDAP status: Due, Education has been provided regarding the importance of this vaccine. Advised may receive this vaccine at local pharmacy or Health Dept. Aware to provide a copy of the vaccination record if obtained from local pharmacy or Health Dept. Verbalized acceptance and understanding.  Flu Vaccine status: Due, Education has been provided regarding the importance of this vaccine. Advised may receive this vaccine at local pharmacy or Health Dept. Aware to provide a copy of the vaccination record if obtained from local pharmacy or Health Dept. Verbalized acceptance and understanding.  Pneumococcal vaccine status: Up to date  Covid-19 vaccine status: Information provided on how to obtain vaccines.   Qualifies for Shingles Vaccine? Yes   Zostavax completed No   Shingrix Completed?: No.    Education has been provided regarding the importance of this vaccine. Patient has been advised to call insurance company to determine out of pocket expense if they have not yet received this vaccine. Advised may also receive vaccine at local pharmacy or Health Dept. Verbalized acceptance and understanding.  Screening Tests Health Maintenance  Topic Date Due   Zoster Vaccines- Shingrix (1 of 2) Never done   TETANUS/TDAP  02/19/2021   COVID-19 Vaccine (4 - Moderna series) 05/03/2021   INFLUENZA VACCINE  08/28/2021   DEXA SCAN  12/05/2021   MAMMOGRAM  05/01/2022   Pneumonia Vaccine 78+ Years old  Completed   HPV VACCINES  Aged Out     Health Maintenance  Health Maintenance Due  Topic Date Due   Zoster Vaccines- Shingrix (1 of 2)  Never done   TETANUS/TDAP  02/19/2021   COVID-19 Vaccine (4 - Moderna series) 05/03/2021   INFLUENZA VACCINE  08/28/2021    Colorectal cancer screening: No longer required.   Mammogram status: Completed 04/2021. Repeat every year  Bone Density status: Completed 12/06/19. Results reflect: Bone density results: OSTEOPENIA. Repeat every 2 years.  Lung Cancer Screening: (Low Dose CT Chest recommended if Age 43-80 years, 30 pack-year currently smoking OR have quit w/in 15years.) does not qualify.   Additional Screening:  Vision Screening: Recommended annual ophthalmology exams for early detection of glaucoma and other disorders of the eye. Is the patient up to date with their annual eye exam?  Yes   Dental Screening: Recommended annual dental exams for proper oral hygiene     Plan:    1- Tetanus Booster, Shingrix Vaccine, and Flu shot due 2- DEXA due in November  I have personally reviewed and noted the following in the patient's chart:   Medical and social history Use of alcohol, tobacco or illicit drugs  Current medications and supplements including opioid prescriptions.  Functional ability and status Nutritional status Physical activity Advanced directives List of other physicians Hospitalizations, surgeries, and ER visits in previous 12 months Vitals Screenings to include cognitive, depression, and falls Referrals and appointments  In addition, I have reviewed and discussed with patient certain preventive protocols, quality metrics, and best practice recommendations. A written personalized care plan for preventive services as well as general preventive health recommendations were provided to patient.     Jacklynn Bue, LPN   6/80/3212

## 2021-09-12 NOTE — Patient Instructions (Signed)
Janet Richardson , Thank you for taking time to come for your Medicare Wellness Visit. I appreciate your ongoing commitment to your health goals. Please review the following plan we discussed and let me know if I can assist you in the future.   Screening recommendations/referrals: -Mammogram: Due April 2024 -Bone Density: Due November 2023 -Recommended yearly ophthalmology/optometry visit for glaucoma screening  and checkup -Recommended yearly dental visit for hygiene and checkup  Vaccinations: -Influenza vaccine: Due Fall 2023 -Pneumococcal vaccine: Complete -Tdap vaccine: Due for booster - can get at most local pharmacies -Shingles vaccine: Due - can get at most local pharmacies   -COVID Booster - be looking out for the updated booster this fall  Advanced directives: Please bring a copy for your medical record    Preventive Care 65 Years and Older, Female Preventive care refers to lifestyle choices and visits with your health care provider that can promote health and wellness. What does preventive care include? A yearly physical exam. This is also called an annual well check. Dental exams once or twice a year. Routine eye exams. Ask your health care provider how often you should have your eyes checked. Personal lifestyle choices, including: Daily care of your teeth and gums. Regular physical activity. Eating a healthy diet. Avoiding tobacco and drug use. Limiting alcohol use. Practicing safe sex. Taking low-dose aspirin every day. Taking vitamin and mineral supplements as recommended by your health care provider. What happens during an annual well check? The services and screenings done by your health care provider during your annual well check will depend on your age, overall health, lifestyle risk factors, and family history of disease. Counseling  Your health care provider may ask you questions about your: Alcohol use. Tobacco use. Drug use. Emotional well-being. Home and  relationship well-being. Sexual activity. Eating habits. History of falls. Memory and ability to understand (cognition). Work and work Astronomer. Reproductive health. Screening  You may have the following tests or measurements: Height, weight, and BMI. Blood pressure. Lipid and cholesterol levels. These may be checked every 5 years, or more frequently if you are over 1 years old. Skin check. Lung cancer screening. You may have this screening every year starting at age 24 if you have a 30-pack-year history of smoking and currently smoke or have quit within the past 15 years. Fecal occult blood test (FOBT) of the stool. You may have this test every year starting at age 83. Flexible sigmoidoscopy or colonoscopy. You may have a sigmoidoscopy every 5 years or a colonoscopy every 10 years starting at age 30. Hepatitis C blood test. Hepatitis B blood test. Sexually transmitted disease (STD) testing. Diabetes screening. This is done by checking your blood sugar (glucose) after you have not eaten for a while (fasting). You may have this done every 1-3 years. Bone density scan. This is done to screen for osteoporosis. You may have this done starting at age 86. Mammogram. This may be done every 1-2 years. Talk to your health care provider about how often you should have regular mammograms. Talk with your health care provider about your test results, treatment options, and if necessary, the need for more tests. Vaccines  Your health care provider may recommend certain vaccines, such as: Influenza vaccine. This is recommended every year. Tetanus, diphtheria, and acellular pertussis (Tdap, Td) vaccine. You may need a Td booster every 10 years. Zoster vaccine. You may need this after age 46. Pneumococcal 13-valent conjugate (PCV13) vaccine. One dose is recommended after age 84. Pneumococcal  polysaccharide (PPSV23) vaccine. One dose is recommended after age 60. Talk to your health care provider  about which screenings and vaccines you need and how often you need them. This information is not intended to replace advice given to you by your health care provider. Make sure you discuss any questions you have with your health care provider. Document Released: 02/10/2015 Document Revised: 10/04/2015 Document Reviewed: 11/15/2014 Elsevier Interactive Patient Education  2017 [place] Prevention in the Home Falls can cause injuries. They can happen to people of all ages. There are many things you can do to make your home safe and to help prevent falls. What can I do on the outside of my home? Regularly fix the edges of walkways and driveways and fix any cracks. Remove anything that might make you trip as you walk through a door, such as a raised step or threshold. Trim any bushes or trees on the path to your home. Use bright outdoor lighting. Clear any walking paths of anything that might make someone trip, such as rocks or tools. Regularly check to see if handrails are loose or broken. Make sure that both sides of any steps have handrails. Any raised decks and porches should have guardrails on the edges. Have any leaves, snow, or ice cleared regularly. Use sand or salt on walking paths during winter. Clean up any spills in your garage right away. This includes oil or grease spills. What can I do in the bathroom? Use night lights. Install grab bars by the toilet and in the tub and shower. Do not use towel bars as grab bars. Use non-skid mats or decals in the tub or shower. If you need to sit down in the shower, use a plastic, non-slip stool. Keep the floor dry. Clean up any water that spills on the floor as soon as it happens. Remove soap buildup in the tub or shower regularly. Attach bath mats securely with double-sided non-slip rug tape. Do not have throw rugs and other things on the floor that can make you trip. What can I do in the bedroom? Use night lights. Make sure  that you have a light by your bed that is easy to reach. Do not use any sheets or blankets that are too big for your bed. They should not hang down onto the floor. Have a firm chair that has side arms. You can use this for support while you get dressed. Do not have throw rugs and other things on the floor that can make you trip. What can I do in the kitchen? Clean up any spills right away. Avoid walking on wet floors. Keep items that you use a lot in easy-to-reach places. If you need to reach something above you, use a strong step stool that has a grab bar. Keep electrical cords out of the way. Do not use floor polish or wax that makes floors slippery. If you must use wax, use non-skid floor wax. Do not have throw rugs and other things on the floor that can make you trip. What can I do with my stairs? Do not leave any items on the stairs. Make sure that there are handrails on both sides of the stairs and use them. Fix handrails that are broken or loose. Make sure that handrails are as long as the stairways. Check any carpeting to make sure that it is firmly attached to the stairs. Fix any carpet that is loose or worn. Avoid having throw rugs at the top  or bottom of the stairs. If you do have throw rugs, attach them to the floor with carpet tape. Make sure that you have a light switch at the top of the stairs and the bottom of the stairs. If you do not have them, ask someone to add them for you. What else can I do to help prevent falls? Wear shoes that: Do not have high heels. Have rubber bottoms. Are comfortable and fit you well. Are closed at the toe. Do not wear sandals. If you use a stepladder: Make sure that it is fully opened. Do not climb a closed stepladder. Make sure that both sides of the stepladder are locked into place. Ask someone to hold it for you, if possible. Clearly mark and make sure that you can see: Any grab bars or handrails. First and last steps. Where the edge of  each step is. Use tools that help you move around (mobility aids) if they are needed. These include: Canes. Walkers. Scooters. Crutches. Turn on the lights when you go into a dark area. Replace any light bulbs as soon as they burn out. Set up your furniture so you have a clear path. Avoid moving your furniture around. If any of your floors are uneven, fix them. If there are any pets around you, be aware of where they are. Review your medicines with your doctor. Some medicines can make you feel dizzy. This can increase your chance of falling. Ask your doctor what other things that you can do to help prevent falls. This information is not intended to replace advice given to you by your health care provider. Make sure you discuss any questions you have with your health care provider. Document Released: 11/10/2008 Document Revised: 06/22/2015 Document Reviewed: 02/18/2014 Elsevier Interactive Patient Education  2017 [employer].

## 2021-10-03 DIAGNOSIS — E039 Hypothyroidism, unspecified: Secondary | ICD-10-CM | POA: Diagnosis not present

## 2021-10-03 DIAGNOSIS — E663 Overweight: Secondary | ICD-10-CM | POA: Diagnosis not present

## 2021-10-03 DIAGNOSIS — M8580 Other specified disorders of bone density and structure, unspecified site: Secondary | ICD-10-CM | POA: Diagnosis not present

## 2021-10-03 DIAGNOSIS — E785 Hyperlipidemia, unspecified: Secondary | ICD-10-CM | POA: Diagnosis not present

## 2021-10-11 DIAGNOSIS — M7662 Achilles tendinitis, left leg: Secondary | ICD-10-CM | POA: Diagnosis not present

## 2021-11-22 DIAGNOSIS — M7662 Achilles tendinitis, left leg: Secondary | ICD-10-CM | POA: Diagnosis not present

## 2021-12-04 ENCOUNTER — Ambulatory Visit (INDEPENDENT_AMBULATORY_CARE_PROVIDER_SITE_OTHER): Payer: PPO

## 2021-12-04 DIAGNOSIS — Z23 Encounter for immunization: Secondary | ICD-10-CM | POA: Diagnosis not present

## 2021-12-13 DIAGNOSIS — M7662 Achilles tendinitis, left leg: Secondary | ICD-10-CM | POA: Diagnosis not present

## 2022-05-06 DIAGNOSIS — Z6825 Body mass index (BMI) 25.0-25.9, adult: Secondary | ICD-10-CM | POA: Diagnosis not present

## 2022-05-06 DIAGNOSIS — Z1231 Encounter for screening mammogram for malignant neoplasm of breast: Secondary | ICD-10-CM | POA: Diagnosis not present

## 2022-05-06 DIAGNOSIS — Z01419 Encounter for gynecological examination (general) (routine) without abnormal findings: Secondary | ICD-10-CM | POA: Diagnosis not present

## 2022-05-06 LAB — HM MAMMOGRAPHY

## 2022-05-10 ENCOUNTER — Encounter: Payer: Self-pay | Admitting: Family Medicine

## 2022-07-18 ENCOUNTER — Telehealth: Payer: Self-pay | Admitting: Family Medicine

## 2022-07-18 DIAGNOSIS — N3091 Cystitis, unspecified with hematuria: Secondary | ICD-10-CM | POA: Diagnosis not present

## 2022-07-18 DIAGNOSIS — R509 Fever, unspecified: Secondary | ICD-10-CM | POA: Diagnosis not present

## 2022-07-18 DIAGNOSIS — R1084 Generalized abdominal pain: Secondary | ICD-10-CM | POA: Diagnosis not present

## 2022-07-18 DIAGNOSIS — R350 Frequency of micturition: Secondary | ICD-10-CM | POA: Diagnosis not present

## 2022-07-18 NOTE — Telephone Encounter (Signed)
Pt called today to request a same day appointment for the following symptoms:vomiting not able to hold food down.   Unfortunately, our schedule is full and we have no openings between today or tomorrow. Pt was notified that they can wait on a call from our triage or they can do an e-visit through MyChart with a [place] Provider from home if they have the ability .

## 2022-07-22 NOTE — Progress Notes (Signed)
s  Subjective:  Patient ID: Janet Richardson, female    DOB: Jun 01, 1940  Age: 82 y.o. MRN: [id number]  Chief Complaint  Patient presents with   Medical Management of Chronic Issues    HPI   Thyroid: Synthroid 100 mcg  daily, TSH 1.4 (08/10/21)  Cholesterol: Zocor 20 mg daily  UTI has been taking Augmentin. (Was seen at [facility]) Will discuss with Orthopedics about ordering DEXA. States she is UTD with Shingrix vaccines. Patient refused Tdap at this time Patient denies any issues with her medications. Patient states she has no HA, dizziness, heart racing, or changes to heat or cold intolerance.     07/25/2022    9:55 AM 09/12/2021   10:17 AM 08/10/2021    8:20 AM 01/02/2021   10:12 AM 11/23/2019    9:01 AM  Depression screen PHQ 2/9  Decreased Interest 0 0 0 0 0  Down, Depressed, Hopeless 0 0 0 0 0  PHQ - 2 Score 0 0 0 0 0  Altered sleeping 0      Tired, decreased energy 0      Change in appetite 0      Feeling bad or failure about yourself  0      Trouble concentrating 0      Moving slowly or fidgety/restless 0      Suicidal thoughts 0      PHQ-9 Score 0      Difficult doing work/chores Not difficult at all            07/25/2022    9:54 AM  Fall Risk   Falls in the past year? 0  Number falls in past yr: 0  Injury with Fall? 0  Risk for fall due to : No Fall Risks  Follow up Falls evaluation completed    Patient Care Team: Blane Ohara, MD as PCP - General (Family Medicine) Annamaria Helling, MD as Consulting Physician (Obstetrics and Gynecology)   Review of Systems  Constitutional:  Negative for chills, fatigue and fever.  HENT:  Negative for congestion, ear pain, rhinorrhea and sore throat.   Respiratory:  Negative for cough and shortness of breath.   Cardiovascular:  Negative for chest pain.  Gastrointestinal:  Negative for abdominal pain, constipation, diarrhea, nausea and vomiting.  Genitourinary:  Negative for dysuria and urgency.  Musculoskeletal:  Negative for back  pain and myalgias.  Neurological:  Negative for dizziness, weakness, light-headedness and headaches.  Psychiatric/Behavioral:  Negative for dysphoric mood. The patient is not nervous/anxious.     Current Outpatient Medications on File Prior to Visit  Medication Sig Dispense Refill   amoxicillin-clavulanate (AUGMENTIN) 875-125 MG tablet Take 1 tablet by mouth 2 (two) times daily.     Calcium-Vitamin D-Vitamin K (VIACTIV CALCIUM PLUS D PO) Take 650 mg by mouth in the morning and at bedtime.     levothyroxine (SYNTHROID) 100 MCG tablet Take 1 tablet (100 mcg total) by mouth daily. 90 tablet 3   Multiple Vitamins-Minerals (PRESERVISION AREDS PO) Take by mouth.     simvastatin (ZOCOR) 20 MG tablet TAKE 1 TABLET BY MOUTH EVERYDAY AT BEDTIME 90 tablet 3   No current facility-administered medications on file prior to visit.   Past Medical History:  Diagnosis Date   Adjustment insomnia    Mixed hyperlipidemia    Osteoarthritis    Other specified hypothyroidism    Past Surgical History:  Procedure Laterality Date   CATARACT EXTRACTION Right    CATARACT EXTRACTION Left 10/2017  HIP ARTHROPLASTY Right    JOINT REPLACEMENT Right    total hip arthroplasty   THYROIDECTOMY     TONSILLECTOMY      Family History  Problem Relation Age of Onset   Heart failure Father    Cancer Brother        lung   Cancer Brother        prostate   Social History   Socioeconomic History   Marital status: Divorced    Spouse name: Not on file   Number of children: 2   Years of education: Not on file   Highest education level: Not on file  Occupational History   Occupation: Print production planner    Comment: [employer]  Tobacco Use   Smoking status: Never   Smokeless tobacco: Never  Vaping Use   Vaping Use: Never used  Substance and Sexual Activity   Alcohol use: Yes    Alcohol/week: 1.0 standard drink of alcohol    Types: 1 Glasses of wine per week   Drug use: Never   Sexual activity: Not  Currently  Other Topics Concern   Not on file  Social History Narrative   Not on file   Social Determinants of Health   Financial Resource Strain: Low Risk  (07/25/2022)   Overall Financial Resource Strain (CARDIA)    Difficulty of Paying Living Expenses: Not hard at all  Food Insecurity: No Food Insecurity (07/25/2022)   Hunger Vital Sign    Worried About Running Out of Food in the Last Year: Never true    Ran Out of Food in the Last Year: Never true  Transportation Needs: No Transportation Needs (07/25/2022)   PRAPARE - Administrator, Civil Service (Medical): No    Lack of Transportation (Non-Medical): No  Physical Activity: Sufficiently Active (07/25/2022)   Exercise Vital Sign    Days of Exercise per Week: 3 days    Minutes of Exercise per Session: 50 min  Stress: No Stress Concern Present (07/25/2022)   [employer] of Occupational Health - Occupational Stress Questionnaire    Feeling of Stress : Not at all  Social Connections: Moderately Integrated (07/25/2022)   Social Connection and Isolation Panel [NHANES]    Frequency of Communication with Friends and Family: More than three times a week    Frequency of Social Gatherings with Friends and Family: More than three times a week    Attends Religious Services: 1 to 4 times per year    Active Member of [employer] or Organizations: Yes    Attends Banker Meetings: 1 to 4 times per year    Marital Status: Divorced    Objective:  BP 126/70   Pulse 71   Temp (!) 97.2 F (36.2 C)   Ht 5\' 4"  (1.626 m)   Wt 144 lb (65.3 kg)   SpO2 95%   BMI 24.72 kg/m      07/25/2022    9:53 AM 09/12/2021    9:55 AM 08/16/2021    2:06 PM  BP/Weight  Systolic BP 126 108 110  Diastolic BP 70 72 74  Wt. (Lbs) 144 149.4 147  BMI 24.72 kg/m2 25.64 kg/m2 25.23 kg/m2    Physical Exam Vitals reviewed.  Constitutional:      Appearance: Normal appearance. She is normal weight.  Neck:     Vascular: No carotid bruit.   Cardiovascular:     Rate and Rhythm: Normal rate and regular rhythm.     Heart sounds:  Normal heart sounds.  Pulmonary:     Effort: Pulmonary effort is normal. No respiratory distress.     Breath sounds: Normal breath sounds.  Abdominal:     General: Abdomen is flat. Bowel sounds are normal.     Palpations: Abdomen is soft.     Tenderness: There is no abdominal tenderness.  Neurological:     Mental Status: She is alert and oriented to person, place, and time.  Psychiatric:        Mood and Affect: Mood normal.        Behavior: Behavior normal.     Diabetic Foot Exam - Simple   No data filed      Lab Results  Component Value Date   WBC 7.3 07/25/2022   HGB 13.4 07/25/2022   HCT 40.5 07/25/2022   PLT 411 07/25/2022   GLUCOSE 97 07/25/2022   CHOL 175 07/25/2022   TRIG 84 07/25/2022   HDL 59 07/25/2022   LDLCALC 100 (H) 07/25/2022   ALT 22 07/25/2022   AST 18 07/25/2022   NA 142 07/25/2022   K 4.6 07/25/2022   CL 103 07/25/2022   CREATININE 0.73 07/25/2022   BUN 18 07/25/2022   CO2 23 07/25/2022   TSH 0.766 07/25/2022   HGBA1C 5.5 08/16/2021      Assessment & Plan:    Mixed hyperlipidemia Assessment & Plan: Well controlled.  Continue to work on eating a healthy diet and exercise.  Labs drawn today.   No major side effects reported, and no issues with compliance. The current medical regimen is effective;  continue present plan with Simvastatin 20mg  Will adjust medication as needed depending on labs Lab Results  Component Value Date   LDLCALC 100 (H) 07/25/2022     Orders: -     CBC with Differential/Platelet -     Comprehensive metabolic panel -     Lipid panel  Other specified hypothyroidism Assessment & Plan: Labs drawn today Continue taking Synthroid Patient denies any major side effects or issues taking the medication Will adjust treatment as needed depending on labs.  Orders: -     TSH  Encounter for vitamin deficiency  screening Assessment & Plan: Labs drawn today Will adjust medication as needed depending on results  Orders: -     VITAMIN D 25 Hydroxy (Vit-D Deficiency, Fractures)     No orders of the defined types were placed in this encounter.   Orders Placed This Encounter  Procedures   CBC with Differential/Platelet   Comprehensive metabolic panel   Lipid panel   TSH   VITAMIN D 25 Hydroxy (Vit-D Deficiency, Fractures)     Follow-up: Return in about 6 months (around 01/24/2023) for chronic, fasting, Chronic, Huston Foley.   I,Katherina A Bramblett,acting as a scribe for [employer], PA.,have documented all relevant documentation on the behalf of Langley Gauss, PA,as directed by  Langley Gauss, PA while in the presence of Langley Gauss, [place].   An After Visit Summary was printed and given to the patient.  Langley Gauss, [place] [facility] [phone]

## 2022-07-25 ENCOUNTER — Ambulatory Visit (INDEPENDENT_AMBULATORY_CARE_PROVIDER_SITE_OTHER): Payer: PPO | Admitting: Physician Assistant

## 2022-07-25 ENCOUNTER — Encounter: Payer: Self-pay | Admitting: Physician Assistant

## 2022-07-25 VITALS — BP 126/70 | HR 71 | Temp 97.2°F | Ht 64.0 in | Wt 144.0 lb

## 2022-07-25 DIAGNOSIS — Z1321 Encounter for screening for nutritional disorder: Secondary | ICD-10-CM | POA: Diagnosis not present

## 2022-07-25 DIAGNOSIS — E782 Mixed hyperlipidemia: Secondary | ICD-10-CM | POA: Diagnosis not present

## 2022-07-25 DIAGNOSIS — E038 Other specified hypothyroidism: Secondary | ICD-10-CM

## 2022-07-26 ENCOUNTER — Encounter: Payer: Self-pay | Admitting: Physician Assistant

## 2022-07-26 DIAGNOSIS — Z1321 Encounter for screening for nutritional disorder: Secondary | ICD-10-CM | POA: Insufficient documentation

## 2022-07-26 LAB — COMPREHENSIVE METABOLIC PANEL
ALT: 22 IU/L (ref 0–32)
AST: 18 IU/L (ref 0–40)
Albumin: 4.5 g/dL (ref 3.8–4.8)
Alkaline Phosphatase: 85 IU/L (ref 44–121)
BUN/Creatinine Ratio: 25 (ref 12–28)
BUN: 18 mg/dL (ref 8–27)
Bilirubin Total: 0.7 mg/dL (ref 0.0–1.2)
CO2: 23 mmol/L (ref 20–29)
Calcium: 9.6 mg/dL (ref 8.7–10.3)
Chloride: 103 mmol/L (ref 96–106)
Creatinine, Ser: 0.73 mg/dL (ref 0.57–1.00)
Globulin, Total: 2.4 g/dL (ref 1.5–4.5)
Glucose: 97 mg/dL (ref 70–99)
Potassium: 4.6 mmol/L (ref 3.5–5.2)
Sodium: 142 mmol/L (ref 134–144)
Total Protein: 6.9 g/dL (ref 6.0–8.5)
eGFR: 83 mL/min/{1.73_m2} (ref >59)

## 2022-07-26 LAB — CBC WITH DIFFERENTIAL/PLATELET
Basophils Absolute: 0.1 10*3/uL (ref 0.0–0.2)
Basos: 1 %
EOS (ABSOLUTE): 0.2 10*3/uL (ref 0.0–0.4)
Eos: 3 %
Hematocrit: 40.5 % (ref 34.0–46.6)
Hemoglobin: 13.4 g/dL (ref 11.1–15.9)
Immature Grans (Abs): 0 10*3/uL (ref 0.0–0.1)
Immature Granulocytes: 0 %
Lymphocytes Absolute: 2.3 10*3/uL (ref 0.7–3.1)
Lymphs: 32 %
MCH: 32 pg (ref 26.6–33.0)
MCHC: 33.1 g/dL (ref 31.5–35.7)
MCV: 97 fL (ref 79–97)
Monocytes Absolute: 0.6 10*3/uL (ref 0.1–0.9)
Monocytes: 8 %
Neutrophils Absolute: 4.1 10*3/uL (ref 1.4–7.0)
Neutrophils: 56 %
Platelets: 411 10*3/uL (ref 150–450)
RBC: 4.19 x10E6/uL (ref 3.77–5.28)
RDW: 11.5 % — ABNORMAL LOW (ref 11.7–15.4)
WBC: 7.3 10*3/uL (ref 3.4–10.8)

## 2022-07-26 LAB — LIPID PANEL
Chol/HDL Ratio: 3 ratio (ref 0.0–4.4)
Cholesterol, Total: 175 mg/dL (ref 100–199)
HDL: 59 mg/dL (ref >39)
LDL Chol Calc (NIH): 100 mg/dL — ABNORMAL HIGH (ref 0–99)
Triglycerides: 84 mg/dL (ref 0–149)
VLDL Cholesterol Cal: 16 mg/dL (ref 5–40)

## 2022-07-26 LAB — VITAMIN D 25 HYDROXY (VIT D DEFICIENCY, FRACTURES): Vit D, 25-Hydroxy: 34.6 ng/mL (ref 30.0–100.0)

## 2022-07-26 LAB — TSH: TSH: 0.766 u[IU]/mL (ref 0.450–4.500)

## 2022-07-26 NOTE — Assessment & Plan Note (Signed)
Labs drawn today Continue taking Synthroid Patient denies any major side effects or issues taking the medication Will adjust treatment as needed depending on labs.

## 2022-07-26 NOTE — Assessment & Plan Note (Signed)
Well controlled.  Continue to work on eating a healthy diet and exercise.  Labs drawn today.   No major side effects reported, and no issues with compliance. The current medical regimen is effective;  continue present plan with Simvastatin 20mg  Will adjust medication as needed depending on labs Lab Results  Component Value Date   LDLCALC 100 (H) 07/25/2022

## 2022-07-26 NOTE — Assessment & Plan Note (Signed)
Labs drawn today Will adjust medication as needed depending on results

## 2022-07-29 DIAGNOSIS — M79673 Pain in unspecified foot: Secondary | ICD-10-CM | POA: Diagnosis not present

## 2022-07-29 DIAGNOSIS — M7662 Achilles tendinitis, left leg: Secondary | ICD-10-CM | POA: Diagnosis not present

## 2022-07-30 DIAGNOSIS — R350 Frequency of micturition: Secondary | ICD-10-CM | POA: Diagnosis not present

## 2022-07-30 DIAGNOSIS — N390 Urinary tract infection, site not specified: Secondary | ICD-10-CM | POA: Diagnosis not present

## 2022-08-07 DIAGNOSIS — R112 Nausea with vomiting, unspecified: Secondary | ICD-10-CM | POA: Diagnosis not present

## 2022-08-08 ENCOUNTER — Other Ambulatory Visit: Payer: Self-pay | Admitting: Family Medicine

## 2022-08-08 DIAGNOSIS — E782 Mixed hyperlipidemia: Secondary | ICD-10-CM

## 2022-08-14 ENCOUNTER — Other Ambulatory Visit: Payer: Self-pay | Admitting: Family Medicine

## 2022-08-14 DIAGNOSIS — E038 Other specified hypothyroidism: Secondary | ICD-10-CM

## 2022-08-19 DIAGNOSIS — M7732 Calcaneal spur, left foot: Secondary | ICD-10-CM | POA: Diagnosis not present

## 2022-08-19 DIAGNOSIS — M7662 Achilles tendinitis, left leg: Secondary | ICD-10-CM | POA: Diagnosis not present

## 2022-08-19 DIAGNOSIS — M79672 Pain in left foot: Secondary | ICD-10-CM | POA: Diagnosis not present

## 2022-09-17 DIAGNOSIS — S0181XA Laceration without foreign body of other part of head, initial encounter: Secondary | ICD-10-CM | POA: Diagnosis not present

## 2022-09-26 ENCOUNTER — Ambulatory Visit (INDEPENDENT_AMBULATORY_CARE_PROVIDER_SITE_OTHER): Payer: PPO

## 2022-09-26 VITALS — BP 110/72 | HR 74 | Resp 16 | Ht 64.0 in | Wt 142.0 lb

## 2022-09-26 DIAGNOSIS — Z0000 Encounter for general adult medical examination without abnormal findings: Secondary | ICD-10-CM

## 2022-09-26 NOTE — Progress Notes (Signed)
Subjective:   Janet Richardson is a 82 y.o. female who presents for Medicare Annual (Subsequent) preventive examination.  This wellness visit is conducted by a nurse.  The patient's medications were reviewed and reconciled since the patient's last visit.  History details were provided by the patient.  The history appears to be reliable.    Medical History: Patient history and Family history was reviewed  Medications, Allergies, and preventative health maintenance was reviewed and updated.   Visit Complete: In person  Cardiac Risk Factors include: advanced age (>9men, >72 women)     Objective:    Today's Vitals   09/26/22 1310  BP: 110/72  Pulse: 74  Resp: 16  SpO2: 98%  Weight: 142 lb (64.4 kg)  Height: 5\' 4"  (1.626 m)  PainSc: 0-No pain   Body mass index is 24.37 kg/m.     09/12/2021   10:19 AM 11/23/2019    9:39 AM 11/23/2019    9:03 AM  Advanced Directives  Does Patient Have a Medical Advance Directive? Yes No Yes  Type of Advance Directive Living will    Does patient want to make changes to medical advance directive? No - Patient declined  Yes (Inpatient - patient defers changing a medical advance directive and declines information at this time)    Current Medications (verified) Outpatient Encounter Medications as of 09/26/2022  Medication Sig   Calcium-Vitamin D-Vitamin K (VIACTIV CALCIUM PLUS D PO) Take 650 mg by mouth in the morning and at bedtime.   levothyroxine (SYNTHROID) 100 MCG tablet TAKE 1 TABLET BY MOUTH EVERY DAY   Multiple Vitamins-Minerals (PRESERVISION AREDS PO) Take by mouth.   simvastatin (ZOCOR) 20 MG tablet TAKE 1 TABLET BY MOUTH EVERYDAY AT BEDTIME   [DISCONTINUED] amoxicillin-clavulanate (AUGMENTIN) 875-125 MG tablet Take 1 tablet by mouth 2 (two) times daily.   No facility-administered encounter medications on file as of 09/26/2022.    Allergies (verified) Codeine   History: Past Medical History:  Diagnosis Date   Adjustment  insomnia    Mixed hyperlipidemia    Osteoarthritis    Other specified hypothyroidism    Past Surgical History:  Procedure Laterality Date   CATARACT EXTRACTION Right    CATARACT EXTRACTION Left 10/2017   HIP ARTHROPLASTY Right    JOINT REPLACEMENT Right    total hip arthroplasty   THYROIDECTOMY     TONSILLECTOMY     Family History  Problem Relation Age of Onset   Heart failure Father    Cancer Brother        lung   Cancer Brother        prostate   Social History   Socioeconomic History   Marital status: Divorced    Spouse name: Not on file   Number of children: 2   Years of education: Not on file   Highest education level: Not on file  Occupational History   Occupation: Print production planner    Comment: [employer]  Tobacco Use   Smoking status: Never   Smokeless tobacco: Never  Vaping Use   Vaping status: Never Used  Substance and Sexual Activity   Alcohol use: Yes    Alcohol/week: 1.0 standard drink of alcohol    Types: 1 Glasses of wine per week   Drug use: Never   Sexual activity: Not Currently  Other Topics Concern   Not on file  Social History Narrative   Not on file   Social Determinants of Health   Financial Resource Strain: Low Risk  (  07/25/2022)   Overall Financial Resource Strain (CARDIA)    Difficulty of Paying Living Expenses: Not hard at all  Food Insecurity: No Food Insecurity (07/25/2022)   Hunger Vital Sign    Worried About Running Out of Food in the Last Year: Never true    Ran Out of Food in the Last Year: Never true  Transportation Needs: No Transportation Needs (07/25/2022)   PRAPARE - Administrator, Civil Service (Medical): No    Lack of Transportation (Non-Medical): No  Physical Activity: Sufficiently Active (07/25/2022)   Exercise Vital Sign    Days of Exercise per Week: 3 days    Minutes of Exercise per Session: 50 min  Stress: No Stress Concern Present (07/25/2022)   [employer] of Occupational Health -  Occupational Stress Questionnaire    Feeling of Stress : Not at all  Social Connections: Moderately Integrated (07/25/2022)   Social Connection and Isolation Panel [NHANES]    Frequency of Communication with Friends and Family: More than three times a week    Frequency of Social Gatherings with Friends and Family: More than three times a week    Attends Religious Services: 1 to 4 times per year    Active Member of [employer] or Organizations: Yes    Attends Banker Meetings: 1 to 4 times per year    Marital Status: Divorced    Tobacco Counseling Counseling given: Not Answered   Clinical Intake:  Pre-visit preparation completed: Yes Pain : No/denies pain Pain Score: 0-No pain   BMI - recorded: 24.37 Nutritional Status: BMI of 19-24  Normal Nutritional Risks: None Diabetes: No How often do you need to have someone help you when you read instructions, pamphlets, or other written materials from your doctor or pharmacy?: 1 - Never Interpreter Needed?: No    Activities of Daily Living    09/26/2022    9:57 AM  In your present state of health, do you have any difficulty performing the following activities:  Hearing? 0  Vision? 0  Difficulty concentrating or making decisions? 0  Walking or climbing stairs? 0  Dressing or bathing? 0  Doing errands, shopping? 0  Preparing Food and eating ? N  Using the Toilet? N  In the past six months, have you accidently leaked urine? N  Do you have problems with loss of bowel control? N  Managing your Medications? N  Managing your Finances? N  Housekeeping or managing your Housekeeping? N    Patient Care Team: Blane Ohara, MD as PCP - General (Family Medicine) Annamaria Helling, MD as Consulting Physician (Obstetrics and Gynecology) Dr Pricilla Holm, Ortho     Assessment:   This is a routine wellness examination for [name].  Hearing/Vision screen No results found.  Dietary issues and exercise activities discussed: Aim for 30 minutes  of exercise or brisk walking, 6-8 glasses of water, and 5 servings of fruits and vegetables each day.    Depression Screen    09/26/2022    9:55 AM 07/25/2022    9:55 AM 09/12/2021   10:17 AM 08/10/2021    8:20 AM 01/02/2021   10:12 AM 11/23/2019    9:01 AM 05/24/2019    7:43 AM  PHQ 2/9 Scores  PHQ - 2 Score 0 0 0 0 0 0 0  PHQ- 9 Score  0         Fall Risk    09/26/2022    9:56 AM 07/25/2022    9:54 AM 09/12/2021  10:19 AM 08/10/2021    8:20 AM 01/02/2021   10:12 AM  Fall Risk   Falls in the past year? 1 0 0 0 0  Number falls in past yr: 0 0 0 0 0  Injury with Fall? 1 0 0 0 0  Risk for fall due to : No Fall Risks No Fall Risks No Fall Risks No Fall Risks   Follow up Falls evaluation completed;Education provided Falls evaluation completed Falls evaluation completed;Education provided Falls evaluation completed Falls evaluation completed    MEDICARE RISK AT HOME: Medicare Risk at Home Any stairs in or around the home?: No If so, are there any without handrails?: No Home free of loose throw rugs in walkways, pet beds, electrical cords, etc?: Yes Adequate lighting in your home to reduce risk of falls?: Yes Life alert?: No Use of a cane, walker or w/c?: No Grab bars in the bathroom?: No Shower chair or bench in shower?: Yes Elevated toilet seat or a handicapped toilet?: No  TIMED UP AND GO:  Was the test performed?  Yes  Length of time to ambulate 10 feet: 6 sec Gait steady and fast without use of assistive device    Cognitive Function:        09/26/2022    1:26 PM 09/12/2021   10:21 AM 11/23/2019    9:30 AM  6CIT Screen  What Year? 0 points 0 points 0 points  What month? 0 points 0 points 0 points  What time? 0 points 0 points 0 points  Count back from 20 0 points 0 points 0 points  Months in reverse 0 points 0 points 0 points  Repeat phrase 0 points 0 points 0 points  Total Score 0 points 0 points 0 points    Immunizations Immunization History  Administered  Date(s) Administered   COVID-19, mRNA, vaccine(Comirnaty)12 years and older 12/04/2021   Fluad Quad(high Dose 65+) 11/23/2019, 12/01/2020, 12/04/2021   Influenza-Unspecified 01/28/2013, 11/05/2018   Moderna Covid-19 Vaccine Bivalent Booster 88yrs & up 01/02/2021   Moderna Sars-Covid-2 Vaccination 02/26/2019, 03/24/2019   Pneumococcal Conjugate-13 05/20/2013   Pneumococcal Polysaccharide-23 10/22/2010   Pneumococcal-Unspecified 02/28/2010   Tdap 02/20/2011   Unspecified SARS-COV-2 Vaccination 10/22/2019   Zoster, Live 03/29/2010    TDAP status: Due, Education has been provided regarding the importance of this vaccine. Advised may receive this vaccine at local pharmacy or Health Dept. Aware to provide a copy of the vaccination record if obtained from local pharmacy or Health Dept. Verbalized acceptance and understanding.  Flu Vaccine status: Due, Education has been provided regarding the importance of this vaccine. Advised may receive this vaccine at local pharmacy or Health Dept. Aware to provide a copy of the vaccination record if obtained from local pharmacy or Health Dept. Verbalized acceptance and understanding.  Pneumococcal vaccine status: Up to date  Covid-19 vaccine status: Information provided on how to obtain vaccines.   Qualifies for Shingles Vaccine? Yes   Zostavax completed No   Shingrix Completed?: No.    Education has been provided regarding the importance of this vaccine. Patient has been advised to call insurance company to determine out of pocket expense if they have not yet received this vaccine. Advised may also receive vaccine at local pharmacy or Health Dept. Verbalized acceptance and understanding.  Screening Tests Health Maintenance  Topic Date Due   Zoster Vaccines- Shingrix (1 of 2) 09/02/1991   DTaP/Tdap/Td (2 - Td or Tdap) 02/19/2021   DEXA SCAN  12/05/2021   COVID-19 Vaccine (6 -  2023-24 season) 04/04/2022   Medicare Annual Wellness (AWV)  09/13/2022    INFLUENZA VACCINE  08/29/2022   MAMMOGRAM  05/06/2023   Pneumonia Vaccine 4+ Years old  Completed   HPV VACCINES  Aged Out    Health Maintenance  Health Maintenance Due  Topic Date Due   Zoster Vaccines- Shingrix (1 of 2) 09/02/1991   DTaP/Tdap/Td (2 - Td or Tdap) 02/19/2021   DEXA SCAN  12/05/2021   COVID-19 Vaccine (6 - 2023-24 season) 04/04/2022   Medicare Annual Wellness (AWV)  09/13/2022   INFLUENZA VACCINE  08/29/2022    Colorectal cancer screening: No longer required.   Mammogram status: Completed 04/2022. Repeat every year  Bone Density status: Completed 11/2019.  Lung Cancer Screening: (Low Dose CT Chest recommended if Age 66-80 years, 20 pack-year currently smoking OR have quit w/in 15years.) does not qualify.   Additional Screening:  Vision Screening: Recommended annual ophthalmology exams for early detection of glaucoma and other disorders of the eye. Is the patient up to date with their annual eye exam?  Yes   Dental Screening: Recommended annual dental exams for proper oral hygiene   Community Resource Referral / Chronic Care Management: CRR required this visit?  No   CCM required this visit?  No     Plan:    Counseling was provided today regarding the following topics: healthy eating habits, home safety, vitamin and mineral supplementation (calcium and Vit D), regular exercise, breast self-exam, tobacco avoidance, limitation of alcohol intake, use of seat belts, firearm safety, and fall prevention.  Annual recommendations include: influenza vaccine, dental cleanings, and eye exams.  I have personally reviewed and noted the following in the patient's chart:   Medical and social history Use of alcohol, tobacco or illicit drugs  Current medications and supplements including opioid prescriptions.  Functional ability and status Nutritional status Physical activity Advanced directives List of other physicians Hospitalizations, surgeries, and ER visits  in previous 12 months Vitals Screenings to include cognitive, depression, and falls Referrals and appointments  In addition, I have reviewed and discussed with patient certain preventive protocols, quality metrics, and best practice recommendations. A written personalized care plan for preventive services as well as general preventive health recommendations were provided to patient.     Jacklynn Bue, LPN   [id number]   After Visit Summary: (Mail) Due to this being a telephonic visit, the after visit summary with patients personalized plan was offered to patient via mail

## 2022-09-26 NOTE — Patient Instructions (Signed)
Janet Richardson , Thank you for taking time to come for your Medicare Wellness Visit. I appreciate your ongoing commitment to your health goals. Please review the following plan we discussed and let me know if I can assist you in the future.    This is a list of the screening recommended for you and due dates:  Health Maintenance  Topic Date Due   Zoster (Shingles) Vaccine (1 of 2) 09/02/1991   DTaP/Tdap/Td vaccine (2 - Td or Tdap) 02/19/2021   DEXA scan (bone density measurement)  12/05/2021   COVID-19 Vaccine (6 - 2023-24 season) 04/04/2022   Flu Shot  08/29/2022   Mammogram  05/06/2023   Medicare Annual Wellness Visit  09/26/2023   Pneumonia Vaccine  Completed   HPV Vaccine  Aged Out    Preventive Care 65 Years and Older, Female Preventive care refers to lifestyle choices and visits with your health care provider that can promote health and wellness. What does preventive care include? A yearly physical exam. This is also called an annual well check. Dental exams once or twice a year. Routine eye exams. Ask your health care provider how often you should have your eyes checked. Personal lifestyle choices, including: Daily care of your teeth and gums. Regular physical activity. Eating a healthy diet. Avoiding tobacco and drug use. Limiting alcohol use. Practicing safe sex. Taking low-dose aspirin every day. Taking vitamin and mineral supplements as recommended by your health care provider. What happens during an annual well check? The services and screenings done by your health care provider during your annual well check will depend on your age, overall health, lifestyle risk factors, and family history of disease. Counseling  Your health care provider may ask you questions about your: Alcohol use. Tobacco use. Drug use. Emotional well-being. Home and relationship well-being. Sexual activity. Eating habits. History of falls. Memory and ability to understand (cognition). Work  and work Astronomer. Reproductive health. Screening  You may have the following tests or measurements: Height, weight, and BMI. Blood pressure. Lipid and cholesterol levels. These may be checked every 5 years, or more frequently if you are over 5 years old. Skin check. Lung cancer screening. You may have this screening every year starting at age 52 if you have a 30-pack-year history of smoking and currently smoke or have quit within the past 15 years. Fecal occult blood test (FOBT) of the stool. You may have this test every year starting at age 82. Flexible sigmoidoscopy or colonoscopy. You may have a sigmoidoscopy every 5 years or a colonoscopy every 10 years starting at age 73. Hepatitis C blood test. Hepatitis B blood test. Sexually transmitted disease (STD) testing. Diabetes screening. This is done by checking your blood sugar (glucose) after you have not eaten for a while (fasting). You may have this done every 1-3 years. Bone density scan. This is done to screen for osteoporosis. You may have this done starting at age 20. Mammogram. This may be done every 1-2 years. Talk to your health care provider about how often you should have regular mammograms. Talk with your health care provider about your test results, treatment options, and if necessary, the need for more tests. Vaccines  Your health care provider may recommend certain vaccines, such as: Influenza vaccine. This is recommended every year. Tetanus, diphtheria, and acellular pertussis (Tdap, Td) vaccine. You may need a Td booster every 10 years. Zoster vaccine. You may need this after age 43. Pneumococcal 13-valent conjugate (PCV13) vaccine. One dose is recommended  after age 3. Pneumococcal polysaccharide (PPSV23) vaccine. One dose is recommended after age 39. Talk to your health care provider about which screenings and vaccines you need and how often you need them. This information is not intended to replace advice given to  you by your health care provider. Make sure you discuss any questions you have with your health care provider. Document Released: 02/10/2015 Document Revised: 10/04/2015 Document Reviewed: 11/15/2014 Elsevier Interactive Patient Education  2017 [employer].  Fall Prevention in the Home Falls can cause injuries. They can happen to people of all ages. There are many things you can do to make your home safe and to help prevent falls. What can I do on the outside of my home? Regularly fix the edges of walkways and driveways and fix any cracks. Remove anything that might make you trip as you walk through a door, such as a raised step or threshold. Trim any bushes or trees on the path to your home. Use bright outdoor lighting. Clear any walking paths of anything that might make someone trip, such as rocks or tools. Regularly check to see if handrails are loose or broken. Make sure that both sides of any steps have handrails. Any raised decks and porches should have guardrails on the edges. Have any leaves, snow, or ice cleared regularly. Use sand or salt on walking paths during winter. Clean up any spills in your garage right away. This includes oil or grease spills. What can I do in the bathroom? Use night lights. Install grab bars by the toilet and in the tub and shower. Do not use towel bars as grab bars. Use non-skid mats or decals in the tub or shower. If you need to sit down in the shower, use a plastic, non-slip stool. Keep the floor dry. Clean up any water that spills on the floor as soon as it happens. Remove soap buildup in the tub or shower regularly. Attach bath mats securely with double-sided non-slip rug tape. Do not have throw rugs and other things on the floor that can make you trip. What can I do in the bedroom? Use night lights. Make sure that you have a light by your bed that is easy to reach. Do not use any sheets or blankets that are too big for your bed. They should not  hang down onto the floor. Have a firm chair that has side arms. You can use this for support while you get dressed. Do not have throw rugs and other things on the floor that can make you trip. What can I do in the kitchen? Clean up any spills right away. Avoid walking on wet floors. Keep items that you use a lot in easy-to-reach places. If you need to reach something above you, use a strong step stool that has a grab bar. Keep electrical cords out of the way. Do not use floor polish or wax that makes floors slippery. If you must use wax, use non-skid floor wax. Do not have throw rugs and other things on the floor that can make you trip. What can I do with my stairs? Do not leave any items on the stairs. Make sure that there are handrails on both sides of the stairs and use them. Fix handrails that are broken or loose. Make sure that handrails are as long as the stairways. Check any carpeting to make sure that it is firmly attached to the stairs. Fix any carpet that is loose or worn. Avoid having throw  rugs at the top or bottom of the stairs. If you do have throw rugs, attach them to the floor with carpet tape. Make sure that you have a light switch at the top of the stairs and the bottom of the stairs. If you do not have them, ask someone to add them for you. What else can I do to help prevent falls? Wear shoes that: Do not have high heels. Have rubber bottoms. Are comfortable and fit you well. Are closed at the toe. Do not wear sandals. If you use a stepladder: Make sure that it is fully opened. Do not climb a closed stepladder. Make sure that both sides of the stepladder are locked into place. Ask someone to hold it for you, if possible. Clearly mark and make sure that you can see: Any grab bars or handrails. First and last steps. Where the edge of each step is. Use tools that help you move around (mobility aids) if they are needed. These  include: Canes. Walkers. Scooters. Crutches. Turn on the lights when you go into a dark area. Replace any light bulbs as soon as they burn out. Set up your furniture so you have a clear path. Avoid moving your furniture around. If any of your floors are uneven, fix them. If there are any pets around you, be aware of where they are. Review your medicines with your doctor. Some medicines can make you feel dizzy. This can increase your chance of falling. Ask your doctor what other things that you can do to help prevent falls. This information is not intended to replace advice given to you by your health care provider. Make sure you discuss any questions you have with your health care provider. Document Released: 11/10/2008 Document Revised: 06/22/2015 Document Reviewed: 02/18/2014 Elsevier Interactive Patient Education  2017 [employer].

## 2022-11-20 ENCOUNTER — Ambulatory Visit (INDEPENDENT_AMBULATORY_CARE_PROVIDER_SITE_OTHER): Payer: PPO

## 2022-11-20 DIAGNOSIS — Z23 Encounter for immunization: Secondary | ICD-10-CM

## 2023-01-26 NOTE — Progress Notes (Unsigned)
Subjective:  Patient ID: Janet Richardson, female    DOB: 13-Jan-1941  Age: 82 y.o. MRN: [id number]  Chief Complaint  Patient presents with   Medical Management of Chronic Issues    HPI     Thyroid: Synthroid 100 mcg  daily.  Cholesterol: Zocor 20 mg daily  Discussed the use of AI scribe software for clinical note transcription with the patient, who gave verbal consent to proceed.  History of Present Illness   Janet Richardson, an 82 year old female with a history of osteopenia and a hip replacement, presents with ongoing urinary tract infection (UTI) symptoms. She reports feeling pressure but denies severe pain. She has previously been treated with Nitrofurantoin and Augmentin, both of which caused significant nausea leading to discontinuation. She has not been prescribed any further oral medications for the UTI. She also reports no other health issues or complaints.  In addition to her UTI symptoms, she has a history of osteopenia for which she takes calcium supplements. She had a bone density scan two years ago and has been managing an issue with Achilles tendonitis. She also takes amoxicillin prophylactically before dental appointments due to her hip replacement. She has recently started eating yogurt and taking a probiotic to support her gut health.          09/26/2022    9:55 AM 07/25/2022    9:55 AM 09/12/2021   10:17 AM 08/10/2021    8:20 AM 01/02/2021   10:12 AM  Depression screen PHQ 2/9  Decreased Interest 0 0 0 0 0  Down, Depressed, Hopeless 0 0 0 0 0  PHQ - 2 Score 0 0 0 0 0  Altered sleeping  0     Tired, decreased energy  0     Change in appetite  0     Feeling bad or failure about yourself   0     Trouble concentrating  0     Moving slowly or fidgety/restless  0     Suicidal thoughts  0     PHQ-9 Score  0     Difficult doing work/chores  Not difficult at all           09/26/2022    9:56 AM  Fall Risk   Falls in the past year? 1  Number falls in past yr: 0   Injury with Fall? 1  Risk for fall due to : No Fall Risks  Follow up Falls evaluation completed;Education provided    Patient Care Team: Langley Gauss, [place] as PCP - General (Physician Assistant) Annamaria Helling, MD as Consulting Physician (Obstetrics and Gynecology)   Review of Systems  Constitutional:  Negative for chills, fatigue and fever.  HENT:  Negative for congestion, ear pain and sore throat.   Respiratory:  Negative for cough and shortness of breath.   Cardiovascular:  Negative for chest pain and palpitations.  Gastrointestinal:  Positive for abdominal pain (suprapelvic area). Negative for constipation, diarrhea, nausea and vomiting.  Endocrine: Negative for polydipsia, polyphagia and polyuria.  Genitourinary:  Positive for difficulty urinating and frequency. Negative for dysuria.  Musculoskeletal:  Negative for arthralgias, back pain and myalgias.  Skin:  Negative for rash.  Neurological:  Negative for headaches.  Psychiatric/Behavioral:  Negative for dysphoric mood. The patient is not nervous/anxious.     Current Outpatient Medications on File Prior to Visit  Medication Sig Dispense Refill   Calcium-Vitamin D-Vitamin K (VIACTIV CALCIUM PLUS D PO) Take 650 mg by mouth in the morning and  at bedtime.     levothyroxine (SYNTHROID) 100 MCG tablet TAKE 1 TABLET BY MOUTH EVERY DAY 90 tablet 1   Multiple Vitamins-Minerals (PRESERVISION AREDS PO) Take by mouth.     simvastatin (ZOCOR) 20 MG tablet TAKE 1 TABLET BY MOUTH EVERYDAY AT BEDTIME 90 tablet 1   No current facility-administered medications on file prior to visit.   Past Medical History:  Diagnosis Date   Adjustment insomnia    Mixed hyperlipidemia    Osteoarthritis    Other specified hypothyroidism    Past Surgical History:  Procedure Laterality Date   CATARACT EXTRACTION Right    CATARACT EXTRACTION Left 10/2017   HIP ARTHROPLASTY Right    JOINT REPLACEMENT Right    total hip arthroplasty   THYROIDECTOMY      TONSILLECTOMY      Family History  Problem Relation Age of Onset   Heart failure Father    Cancer Brother        lung   Cancer Brother        prostate   Social History   Socioeconomic History   Marital status: Divorced    Spouse name: Not on file   Number of children: 2   Years of education: Not on file   Highest education level: Not on file  Occupational History   Occupation: Print production planner    Comment: [employer]  Tobacco Use   Smoking status: Never   Smokeless tobacco: Never  Vaping Use   Vaping status: Never Used  Substance and Sexual Activity   Alcohol use: Yes    Alcohol/week: 1.0 standard drink of alcohol    Types: 1 Glasses of wine per week    Comment: twice a week   Drug use: Never   Sexual activity: Not Currently    Partners: Male  Other Topics Concern   Not on file  Social History Narrative   Not on file   Social Drivers of Health   Financial Resource Strain: Low Risk  (07/25/2022)   Overall Financial Resource Strain (CARDIA)    Difficulty of Paying Living Expenses: Not hard at all  Food Insecurity: No Food Insecurity (07/25/2022)   Hunger Vital Sign    Worried About Running Out of Food in the Last Year: Never true    Ran Out of Food in the Last Year: Never true  Transportation Needs: No Transportation Needs (07/25/2022)   PRAPARE - Administrator, Civil Service (Medical): No    Lack of Transportation (Non-Medical): No  Physical Activity: Sufficiently Active (07/25/2022)   Exercise Vital Sign    Days of Exercise per Week: 3 days    Minutes of Exercise per Session: 50 min  Stress: No Stress Concern Present (07/25/2022)   [employer] of Occupational Health - Occupational Stress Questionnaire    Feeling of Stress : Not at all  Social Connections: Moderately Integrated (07/25/2022)   Social Connection and Isolation Panel [NHANES]    Frequency of Communication with Friends and Family: More than three times a week    Frequency  of Social Gatherings with Friends and Family: More than three times a week    Attends Religious Services: 1 to 4 times per year    Active Member of Clubs or Organizations: Yes    Attends Banker Meetings: 1 to 4 times per year    Marital Status: Divorced    Objective:  BP 104/60 (Cuff Size: Normal)   Pulse 68   Temp (!) 97.3  F (36.3 C)   Resp 14   Ht 5\' 4"  (1.626 m)   Wt 147 lb (66.7 kg)   SpO2 96%   BMI 25.23 kg/m      01/27/2023    8:25 AM 09/26/2022    1:10 PM 07/25/2022    9:53 AM  BP/Weight  Systolic BP 104 110 126  Diastolic BP 60 72 70  Wt. (Lbs) 147 142 144  BMI 25.23 kg/m2 24.37 kg/m2 24.72 kg/m2    Physical Exam Vitals reviewed.  Constitutional:      Appearance: Normal appearance.  Cardiovascular:     Rate and Rhythm: Normal rate and regular rhythm.     Heart sounds: Normal heart sounds.  Pulmonary:     Effort: Pulmonary effort is normal.     Breath sounds: Normal breath sounds.  Abdominal:     General: Bowel sounds are normal.     Palpations: Abdomen is soft.     Tenderness: There is no abdominal tenderness.  Neurological:     Mental Status: She is alert and oriented to person, place, and time.  Psychiatric:        Mood and Affect: Mood normal.        Behavior: Behavior normal.     Diabetic Foot Exam - Simple   No data filed      Lab Results  Component Value Date   WBC 7.3 07/25/2022   HGB 13.4 07/25/2022   HCT 40.5 07/25/2022   PLT 411 07/25/2022   GLUCOSE 97 07/25/2022   CHOL 175 07/25/2022   TRIG 84 07/25/2022   HDL 59 07/25/2022   LDLCALC 100 (H) 07/25/2022   ALT 22 07/25/2022   AST 18 07/25/2022   NA 142 07/25/2022   K 4.6 07/25/2022   CL 103 07/25/2022   CREATININE 0.73 07/25/2022   BUN 18 07/25/2022   CO2 23 07/25/2022   TSH 0.766 07/25/2022   HGBA1C 5.5 08/16/2021      Assessment & Plan:    Mixed hyperlipidemia Assessment & Plan: Well controlled.  Continue to work on eating a healthy diet and  exercise.  Labs drawn today.   No major side effects reported, and no issues with compliance. The current medical regimen is effective;  continue present plan with Simvastatin 20mg  Will adjust medication as needed depending on labs Lab Results  Component Value Date   LDLCALC 100 (H) 07/25/2022     Orders: -     CBC with Differential/Platelet -     Comprehensive metabolic panel -     Lipid panel  Other specified hypothyroidism Assessment & Plan: Labs drawn today Will adjust treatment depending on results Continue taking Synthroid as directed  Orders: -     T4, free -     TSH  Acute cystitis without hematuria Assessment & Plan: Persistent symptoms despite prior treatment. Urine sample sent for culture to identify specific bacteria for targeted treatment. -Wait for culture results before prescribing new antibiotic.  Orders: -     POCT URINALYSIS DIP (CLINITEK) -     Urine Culture  Osteopenia after menopause Assessment & Plan: Last DEXA scan performed two years ago, patient currently on calcium supplements. No current issues reported. -Deferred repeat DEXA scan until next appointment in six months, or sooner if patient changes her mind.      No orders of the defined types were placed in this encounter.   Orders Placed This Encounter  Procedures   Urine Culture   CBC with  Differential/Platelet   Comprehensive metabolic panel   Lipid panel   T4, free   TSH   POCT URINALYSIS DIP (CLINITEK)    General Health Maintenance -Continue calcium supplements for osteopenia. -Continue probiotics and yogurt intake to maintain gut flora, especially when starting new antibiotic. -Order routine lab work today. -Schedule follow-up appointment in six months (June 2025).        Follow-up: Return in about 6 months (around 07/28/2023) for Chronic, Huston Foley.   I,Marla I Leal-Borjas,acting as a scribe for [employer], PA.,have documented all relevant documentation on the  behalf of Langley Gauss, PA,as directed by  Langley Gauss, PA while in the presence of Langley Gauss, [place].   An After Visit Summary was printed and given to the patient.  Langley Gauss, [place] [facility] [phone]

## 2023-01-27 ENCOUNTER — Encounter: Payer: Self-pay | Admitting: Physician Assistant

## 2023-01-27 ENCOUNTER — Ambulatory Visit: Payer: PPO | Admitting: Physician Assistant

## 2023-01-27 VITALS — BP 104/60 | HR 68 | Temp 97.3°F | Resp 14 | Ht 64.0 in | Wt 147.0 lb

## 2023-01-27 DIAGNOSIS — M8580 Other specified disorders of bone density and structure, unspecified site: Secondary | ICD-10-CM

## 2023-01-27 DIAGNOSIS — E038 Other specified hypothyroidism: Secondary | ICD-10-CM | POA: Diagnosis not present

## 2023-01-27 DIAGNOSIS — E782 Mixed hyperlipidemia: Secondary | ICD-10-CM | POA: Diagnosis not present

## 2023-01-27 DIAGNOSIS — N3000 Acute cystitis without hematuria: Secondary | ICD-10-CM | POA: Diagnosis not present

## 2023-01-27 DIAGNOSIS — Z780 Asymptomatic menopausal state: Secondary | ICD-10-CM | POA: Diagnosis not present

## 2023-01-27 LAB — POCT URINALYSIS DIP (CLINITEK)
Bilirubin, UA: NEGATIVE
Blood, UA: NEGATIVE
Glucose, UA: NEGATIVE mg/dL
Ketones, POC UA: NEGATIVE mg/dL
Nitrite, UA: NEGATIVE
Spec Grav, UA: 1.015 (ref 1.010–1.025)
Urobilinogen, UA: 0.2 U/dL
pH, UA: 6 (ref 5.0–8.0)

## 2023-01-27 NOTE — Assessment & Plan Note (Signed)
Labs drawn today Will adjust treatment depending on results Continue taking Synthroid as directed

## 2023-01-27 NOTE — Assessment & Plan Note (Signed)
Well controlled.  Continue to work on eating a healthy diet and exercise.  Labs drawn today.   No major side effects reported, and no issues with compliance. The current medical regimen is effective;  continue present plan with Simvastatin 20mg  Will adjust medication as needed depending on labs Lab Results  Component Value Date   LDLCALC 100 (H) 07/25/2022

## 2023-01-27 NOTE — Assessment & Plan Note (Signed)
Persistent symptoms despite prior treatment. Urine sample sent for culture to identify specific bacteria for targeted treatment. -Wait for culture results before prescribing new antibiotic.

## 2023-01-27 NOTE — Assessment & Plan Note (Signed)
Last DEXA scan performed two years ago, patient currently on calcium supplements. No current issues reported. -Deferred repeat DEXA scan until next appointment in six months, or sooner if patient changes her mind.

## 2023-01-28 LAB — CBC WITH DIFFERENTIAL/PLATELET
Basophils Absolute: 0 10*3/uL (ref 0.0–0.2)
Basos: 1 %
EOS (ABSOLUTE): 0.2 10*3/uL (ref 0.0–0.4)
Eos: 2 %
Hematocrit: 44 % (ref 34.0–46.6)
Hemoglobin: 14.2 g/dL (ref 11.1–15.9)
Immature Grans (Abs): 0 10*3/uL (ref 0.0–0.1)
Immature Granulocytes: 0 %
Lymphocytes Absolute: 2.3 10*3/uL (ref 0.7–3.1)
Lymphs: 30 %
MCH: 31 pg (ref 26.6–33.0)
MCHC: 32.3 g/dL (ref 31.5–35.7)
MCV: 96 fL (ref 79–97)
Monocytes Absolute: 0.6 10*3/uL (ref 0.1–0.9)
Monocytes: 8 %
Neutrophils Absolute: 4.5 10*3/uL (ref 1.4–7.0)
Neutrophils: 59 %
Platelets: 335 10*3/uL (ref 150–450)
RBC: 4.58 x10E6/uL (ref 3.77–5.28)
RDW: 12.3 % (ref 11.7–15.4)
WBC: 7.6 10*3/uL (ref 3.4–10.8)

## 2023-01-28 LAB — COMPREHENSIVE METABOLIC PANEL
ALT: 29 [IU]/L (ref 0–32)
AST: 29 [IU]/L (ref 0–40)
Albumin: 4.7 g/dL (ref 3.7–4.7)
Alkaline Phosphatase: 84 [IU]/L (ref 44–121)
BUN/Creatinine Ratio: 25 (ref 12–28)
BUN: 20 mg/dL (ref 8–27)
Bilirubin Total: 1.2 mg/dL (ref 0.0–1.2)
CO2: 23 mmol/L (ref 20–29)
Calcium: 10 mg/dL (ref 8.7–10.3)
Chloride: 103 mmol/L (ref 96–106)
Creatinine, Ser: 0.79 mg/dL (ref 0.57–1.00)
Globulin, Total: 2.1 g/dL (ref 1.5–4.5)
Glucose: 80 mg/dL (ref 70–99)
Potassium: 4.5 mmol/L (ref 3.5–5.2)
Sodium: 143 mmol/L (ref 134–144)
Total Protein: 6.8 g/dL (ref 6.0–8.5)
eGFR: 75 mL/min/{1.73_m2} (ref >59)

## 2023-01-28 LAB — LIPID PANEL
Chol/HDL Ratio: 2.6 {ratio} (ref 0.0–4.4)
Cholesterol, Total: 222 mg/dL — ABNORMAL HIGH (ref 100–199)
HDL: 86 mg/dL (ref >39)
LDL Chol Calc (NIH): 120 mg/dL — ABNORMAL HIGH (ref 0–99)
Triglycerides: 92 mg/dL (ref 0–149)
VLDL Cholesterol Cal: 16 mg/dL (ref 5–40)

## 2023-01-28 LAB — T4, FREE: Free T4: 1.9 ng/dL — ABNORMAL HIGH (ref 0.82–1.77)

## 2023-01-28 LAB — TSH: TSH: 0.446 u[IU]/mL — ABNORMAL LOW (ref 0.450–4.500)

## 2023-01-30 LAB — URINE CULTURE

## 2023-02-04 ENCOUNTER — Telehealth: Payer: Self-pay

## 2023-02-04 MED ORDER — CIPROFLOXACIN HCL 500 MG PO TABS: 500.0000 mg | ORAL_TABLET | Freq: Two times a day (BID) | ORAL | 0 refills | Status: AC

## 2023-02-04 NOTE — Telephone Encounter (Signed)
 Patient Made Aware, Verbalized Understanding.

## 2023-02-04 NOTE — Telephone Encounter (Signed)
 Patient called and stated that she seen her labs on my chart and have a UTI and would like RX sent to [facility].

## 2023-02-04 NOTE — Telephone Encounter (Signed)
 Copied from CRM [id number]. Topic: Clinical - Lab/Test Results >> Feb 03, 2023  2:09 PM Gildardo Pounds wrote: Reason for CRM: Patient wants to speak with someone regarding the abnormal test results. 2nd call. Callback number is [phone]

## 2023-02-05 ENCOUNTER — Other Ambulatory Visit: Payer: Self-pay | Admitting: Family Medicine

## 2023-02-05 DIAGNOSIS — E038 Other specified hypothyroidism: Secondary | ICD-10-CM

## 2023-02-05 DIAGNOSIS — E782 Mixed hyperlipidemia: Secondary | ICD-10-CM

## 2023-03-21 DIAGNOSIS — R319 Hematuria, unspecified: Secondary | ICD-10-CM | POA: Diagnosis not present

## 2023-03-21 DIAGNOSIS — R300 Dysuria: Secondary | ICD-10-CM | POA: Diagnosis not present

## 2023-03-21 DIAGNOSIS — R8281 Pyuria: Secondary | ICD-10-CM | POA: Diagnosis not present

## 2023-03-21 DIAGNOSIS — R509 Fever, unspecified: Secondary | ICD-10-CM | POA: Diagnosis not present

## 2023-05-08 DIAGNOSIS — Z1231 Encounter for screening mammogram for malignant neoplasm of breast: Secondary | ICD-10-CM | POA: Diagnosis not present

## 2023-05-08 DIAGNOSIS — Z124 Encounter for screening for malignant neoplasm of cervix: Secondary | ICD-10-CM | POA: Diagnosis not present

## 2023-05-08 DIAGNOSIS — Z01419 Encounter for gynecological examination (general) (routine) without abnormal findings: Secondary | ICD-10-CM | POA: Diagnosis not present

## 2023-05-12 LAB — HM PAP SMEAR

## 2023-05-20 ENCOUNTER — Encounter: Payer: Self-pay | Admitting: Physician Assistant

## 2023-07-27 NOTE — Progress Notes (Unsigned)
 Subjective:  Patient ID: Janet Richardson, female    DOB: 02/10/1940  Age: 83 y.o. MRN: [id number]  No chief complaint on file.   HPI:     09/26/2022    9:55 AM 07/25/2022    9:55 AM 09/12/2021   10:17 AM 08/10/2021    8:20 AM 01/02/2021   10:12 AM  Depression screen PHQ 2/9  Decreased Interest 0 0 0 0 0  Down, Depressed, Hopeless 0 0 0 0 0  PHQ - 2 Score 0 0 0 0 0  Altered sleeping  0     Tired, decreased energy  0     Change in appetite  0     Feeling bad or failure about yourself   0     Trouble concentrating  0     Moving slowly or fidgety/restless  0     Suicidal thoughts  0     PHQ-9 Score  0     Difficult doing work/chores  Not difficult at all           09/26/2022    9:56 AM  Fall Risk   Falls in the past year? 1  Number falls in past yr: 0  Injury with Fall? 1  Risk for fall due to : No Fall Risks  Follow up Falls evaluation completed;Education provided    Patient Care Team: Milon Cleaves, [place] as PCP - General (Physician Assistant) Rox Charleston, MD as Consulting Physician (Obstetrics and Gynecology)   Review of Systems  Current Outpatient Medications on File Prior to Visit  Medication Sig Dispense Refill   Calcium-Vitamin D -Vitamin K (VIACTIV CALCIUM PLUS D PO) Take 650 mg by mouth in the morning and at bedtime.     levothyroxine  (SYNTHROID ) 100 MCG tablet TAKE 1 TABLET BY MOUTH EVERY DAY 90 tablet 1   Multiple Vitamins-Minerals (PRESERVISION AREDS PO) Take by mouth.     simvastatin  (ZOCOR ) 20 MG tablet TAKE 1 TABLET BY MOUTH EVERYDAY AT BEDTIME 90 tablet 1   No current facility-administered medications on file prior to visit.   Past Medical History:  Diagnosis Date   Adjustment insomnia    Mixed hyperlipidemia    Osteoarthritis    Other specified hypothyroidism    Past Surgical History:  Procedure Laterality Date   CATARACT EXTRACTION Right    CATARACT EXTRACTION Left 10/2017   HIP ARTHROPLASTY Right    JOINT REPLACEMENT Right    total hip  arthroplasty   THYROIDECTOMY     TONSILLECTOMY      Family History  Problem Relation Age of Onset   Heart failure Father    Cancer Brother        lung   Cancer Brother        prostate   Social History   Socioeconomic History   Marital status: Divorced    Spouse name: Not on file   Number of children: 2   Years of education: Not on file   Highest education level: 12th grade  Occupational History   Occupation: Print production planner    Comment: [employer]  Tobacco Use   Smoking status: Never   Smokeless tobacco: Never  Vaping Use   Vaping status: Never Used  Substance and Sexual Activity   Alcohol use: Yes    Alcohol/week: 1.0 standard drink of alcohol    Types: 1 Glasses of wine per week    Comment: twice a week   Drug use: Never   Sexual activity: Not Currently  Partners: Male  Other Topics Concern   Not on file  Social History Narrative   Not on file   Social Drivers of Health   Financial Resource Strain: Low Risk  (07/26/2023)   Overall Financial Resource Strain (CARDIA)    Difficulty of Paying Living Expenses: Not hard at all  Food Insecurity: No Food Insecurity (07/26/2023)   Hunger Vital Sign    Worried About Running Out of Food in the Last Year: Never true    Ran Out of Food in the Last Year: Never true  Transportation Needs: No Transportation Needs (07/26/2023)   PRAPARE - Administrator, Civil Service (Medical): No    Lack of Transportation (Non-Medical): No  Physical Activity: Insufficiently Active (07/26/2023)   Exercise Vital Sign    Days of Exercise per Week: 3 days    Minutes of Exercise per Session: 20 min  Stress: No Stress Concern Present (07/26/2023)   [employer] of Occupational Health - Occupational Stress Questionnaire    Feeling of Stress: Not at all  Social Connections: Moderately Integrated (07/26/2023)   Social Connection and Isolation Panel    Frequency of Communication with Friends and Family: More than three  times a week    Frequency of Social Gatherings with Friends and Family: Three times a week    Attends Religious Services: More than 4 times per year    Active Member of Clubs or Organizations: Yes    Attends Banker Meetings: Not on file    Marital Status: Divorced    Objective:  There were no vitals taken for this visit.     01/27/2023    8:25 AM 09/26/2022    1:10 PM 07/25/2022    9:53 AM  BP/Weight  Systolic BP 104 110 126  Diastolic BP 60 72 70  Wt. (Lbs) 147 142 144  BMI 25.23 kg/m2 24.37 kg/m2 24.72 kg/m2    Physical Exam  {Perform Simple Foot Exam  Perform Detailed exam:1} {Insert foot Exam (Optional):30965}   Lab Results  Component Value Date   WBC 7.6 01/27/2023   HGB 14.2 01/27/2023   HCT 44.0 01/27/2023   PLT 335 01/27/2023   GLUCOSE 80 01/27/2023   CHOL 222 (H) 01/27/2023   TRIG 92 01/27/2023   HDL 86 01/27/2023   LDLCALC 120 (H) 01/27/2023   ALT 29 01/27/2023   AST 29 01/27/2023   NA 143 01/27/2023   K 4.5 01/27/2023   CL 103 01/27/2023   CREATININE 0.79 01/27/2023   BUN 20 01/27/2023   CO2 23 01/27/2023   TSH 0.446 (L) 01/27/2023   HGBA1C 5.5 08/16/2021      Assessment & Plan:  There are no diagnoses linked to this encounter.   No orders of the defined types were placed in this encounter.   No orders of the defined types were placed in this encounter.    Follow-up: No follow-ups on file.   I,Marla I Leal-Borjas,acting as a scribe for [employer], PA.,have documented all relevant documentation on the behalf of Nola Angles, PA,as directed by  Nola Angles, PA while in the presence of Nola Angles, [place].   An After Visit Summary was printed and given to the patient.  Nola Angles, [place] [facility] [phone]

## 2023-07-28 ENCOUNTER — Ambulatory Visit (INDEPENDENT_AMBULATORY_CARE_PROVIDER_SITE_OTHER): Payer: PPO | Admitting: Physician Assistant

## 2023-07-28 ENCOUNTER — Encounter: Payer: Self-pay | Admitting: Physician Assistant

## 2023-07-28 VITALS — BP 106/64 | HR 72 | Temp 97.8°F | Resp 16 | Ht 64.0 in | Wt 147.2 lb

## 2023-07-28 DIAGNOSIS — R7309 Other abnormal glucose: Secondary | ICD-10-CM | POA: Diagnosis not present

## 2023-07-28 DIAGNOSIS — E782 Mixed hyperlipidemia: Secondary | ICD-10-CM

## 2023-07-28 DIAGNOSIS — E038 Other specified hypothyroidism: Secondary | ICD-10-CM

## 2023-07-28 NOTE — Assessment & Plan Note (Signed)
 Labs drawn today Will adjust treatment depending on results Continue taking Synthroid as directed

## 2023-07-28 NOTE — Assessment & Plan Note (Signed)
 Well controlled.  Continue to work on eating a healthy diet and exercise.  Labs drawn today.   No major side effects reported, and no issues with compliance. The current medical regimen is effective;  continue present plan with Simvastatin  20mg  Will adjust medication as needed depending on labs Lab Results  Component Value Date   LDLCALC 120 (H) 01/27/2023

## 2023-07-28 NOTE — Assessment & Plan Note (Signed)
 Controlled Continue to monitor diet and exercise Labs drawn today Will adjust treatment depending on results

## 2023-07-29 LAB — CBC WITH DIFFERENTIAL/PLATELET
Basophils Absolute: 0 10*3/uL (ref 0.0–0.2)
Basos: 0 %
EOS (ABSOLUTE): 0.2 10*3/uL (ref 0.0–0.4)
Eos: 2 %
Hematocrit: 42 % (ref 34.0–46.6)
Hemoglobin: 13.7 g/dL (ref 11.1–15.9)
Immature Grans (Abs): 0 10*3/uL (ref 0.0–0.1)
Immature Granulocytes: 0 %
Lymphocytes Absolute: 2.3 10*3/uL (ref 0.7–3.1)
Lymphs: 33 %
MCH: 32.5 pg (ref 26.6–33.0)
MCHC: 32.6 g/dL (ref 31.5–35.7)
MCV: 100 fL — ABNORMAL HIGH (ref 79–97)
Monocytes Absolute: 0.6 10*3/uL (ref 0.1–0.9)
Monocytes: 8 %
Neutrophils Absolute: 4 10*3/uL (ref 1.4–7.0)
Neutrophils: 57 %
Platelets: 291 10*3/uL (ref 150–450)
RBC: 4.21 x10E6/uL (ref 3.77–5.28)
RDW: 12.4 % (ref 11.7–15.4)
WBC: 7.2 10*3/uL (ref 3.4–10.8)

## 2023-07-29 LAB — HEMOGLOBIN A1C
Est. average glucose Bld gHb Est-mCnc: 100 mg/dL
Hgb A1c MFr Bld: 5.1 % (ref 4.8–5.6)

## 2023-07-29 LAB — LIPID PANEL
Chol/HDL Ratio: 2.4 ratio (ref 0.0–4.4)
Cholesterol, Total: 176 mg/dL (ref 100–199)
HDL: 72 mg/dL (ref >39)
LDL Chol Calc (NIH): 91 mg/dL (ref 0–99)
Triglycerides: 70 mg/dL (ref 0–149)
VLDL Cholesterol Cal: 13 mg/dL (ref 5–40)

## 2023-07-29 LAB — TSH: TSH: 0.348 u[IU]/mL — ABNORMAL LOW (ref 0.450–4.500)

## 2023-07-30 ENCOUNTER — Ambulatory Visit: Payer: Self-pay | Admitting: Physician Assistant

## 2023-07-30 ENCOUNTER — Other Ambulatory Visit: Payer: Self-pay

## 2023-07-30 DIAGNOSIS — E038 Other specified hypothyroidism: Secondary | ICD-10-CM

## 2023-07-30 MED ORDER — LEVOTHYROXINE SODIUM 88 MCG PO TABS: 88.0000 ug | ORAL_TABLET | Freq: Every day | ORAL | 3 refills | Status: AC

## 2023-08-01 ENCOUNTER — Other Ambulatory Visit: Payer: Self-pay | Admitting: Physician Assistant

## 2023-08-01 DIAGNOSIS — E782 Mixed hyperlipidemia: Secondary | ICD-10-CM

## 2023-09-02 ENCOUNTER — Other Ambulatory Visit

## 2023-09-02 DIAGNOSIS — E038 Other specified hypothyroidism: Secondary | ICD-10-CM | POA: Diagnosis not present

## 2023-09-03 LAB — TSH: TSH: 0.91 u[IU]/mL (ref 0.450–4.500)

## 2023-09-08 ENCOUNTER — Ambulatory Visit: Payer: Self-pay | Admitting: Physician Assistant

## 2023-09-08 ENCOUNTER — Ambulatory Visit (INDEPENDENT_AMBULATORY_CARE_PROVIDER_SITE_OTHER): Admitting: Physician Assistant

## 2023-09-08 ENCOUNTER — Encounter: Payer: Self-pay | Admitting: Physician Assistant

## 2023-09-08 VITALS — BP 138/72 | HR 61 | Temp 97.8°F | Ht 64.0 in | Wt 151.0 lb

## 2023-09-08 DIAGNOSIS — E038 Other specified hypothyroidism: Secondary | ICD-10-CM

## 2023-09-08 DIAGNOSIS — E782 Mixed hyperlipidemia: Secondary | ICD-10-CM

## 2023-09-08 NOTE — Progress Notes (Signed)
 Subjective:  Patient ID: Janet Richardson, female    DOB: 01/08/1941  Age: 83 y.o. MRN: [id number]  Chief Complaint  Patient presents with   Medical Management of Chronic Issues    HPI:  Discussed the use of AI scribe software for clinical note transcription with the patient, who gave verbal consent to proceed.  History of Present Illness   Janet Richardson is an 83 year old female with Graves' disease who presents with questions on weight gain and thyroid  function.  She has experienced a weight gain of six pounds over the past six weeks, increasing from a stable weight of 145 pounds to 151 pounds. No changes in eating habits or physical activity levels. She consumes three to four glasses of water daily and has no increased thirst.  She has a history of Graves' disease, diagnosed approximately 35 years ago, treated with radioactive iodine. She reports that her thyroid  was previously overactive and that recent labs have shown it is now within the normal range. She questions if the recent weight gain could be related to her thyroid  function normalizing.  No abdominal pain, abnormal bowel movements, or swelling in her legs or feet beyond usual. No recent changes in medication or other symptoms such as increased hunger or pain.  She mentions a difficult year with the loss of two friends and a brother, which may have contributed to stress levels.           09/26/2022    9:55 AM 07/25/2022    9:55 AM 09/12/2021   10:17 AM 08/10/2021    8:20 AM 01/02/2021   10:12 AM  Depression screen PHQ 2/9  Decreased Interest 0 0 0 0 0  Down, Depressed, Hopeless 0 0 0 0 0  PHQ - 2 Score 0 0 0 0 0  Altered sleeping  0     Tired, decreased energy  0     Change in appetite  0     Feeling bad or failure about yourself   0     Trouble concentrating  0     Moving slowly or fidgety/restless  0     Suicidal thoughts  0     PHQ-9 Score  0     Difficult doing work/chores  Not difficult at all            09/26/2022    9:56 AM  Fall Risk   Falls in the past year? 1  Number falls in past yr: 0  Injury with Fall? 1  Risk for fall due to : No Fall Risks  Follow up Falls evaluation completed;Education provided    Patient Care Team: Milon Cleaves, [place] as PCP - General (Physician Assistant) Rox Charleston, MD as Consulting Physician (Obstetrics and Gynecology)   Review of Systems  Constitutional:  Negative for appetite change, fatigue and fever.  HENT:  Negative for congestion, ear pain, sinus pressure and sore throat.   Respiratory:  Negative for cough, chest tightness, shortness of breath and wheezing.   Cardiovascular:  Negative for chest pain and palpitations.  Gastrointestinal:  Negative for abdominal pain, constipation, diarrhea, nausea and vomiting.  Genitourinary:  Negative for dysuria and hematuria.  Musculoskeletal:  Negative for arthralgias, back pain, joint swelling and myalgias.  Skin:  Negative for rash.  Neurological:  Negative for dizziness, weakness and headaches.  Psychiatric/Behavioral:  Negative for dysphoric mood. The patient is not nervous/anxious.     Current Outpatient Medications on File Prior to Visit  Medication  Sig Dispense Refill   Calcium-Vitamin D -Vitamin K (VIACTIV CALCIUM PLUS D PO) Take 650 mg by mouth in the morning and at bedtime.     levothyroxine  (SYNTHROID ) 88 MCG tablet Take 1 tablet (88 mcg total) by mouth daily. 90 tablet 3   Multiple Vitamins-Minerals (PRESERVISION AREDS PO) Take by mouth.     simvastatin  (ZOCOR ) 20 MG tablet TAKE 1 TABLET BY MOUTH EVERYDAY AT BEDTIME 90 tablet 1   No current facility-administered medications on file prior to visit.   Past Medical History:  Diagnosis Date   Adjustment insomnia    Mixed hyperlipidemia    Osteoarthritis    Other specified hypothyroidism    Past Surgical History:  Procedure Laterality Date   CATARACT EXTRACTION Right    CATARACT EXTRACTION Left 10/2017   HIP ARTHROPLASTY Right    JOINT  REPLACEMENT Right    total hip arthroplasty   THYROIDECTOMY     TONSILLECTOMY      Family History  Problem Relation Age of Onset   Heart failure Father    Cancer Brother        lung   Cancer Brother        prostate   Social History   Socioeconomic History   Marital status: Divorced    Spouse name: Not on file   Number of children: 2   Years of education: Not on file   Highest education level: 12th grade  Occupational History   Occupation: Print production planner    Comment: [employer]  Tobacco Use   Smoking status: Never   Smokeless tobacco: Never  Vaping Use   Vaping status: Never Used  Substance and Sexual Activity   Alcohol use: Yes    Alcohol/week: 1.0 standard drink of alcohol    Types: 1 Glasses of wine per week    Comment: twice a week   Drug use: Never   Sexual activity: Not Currently    Partners: Male  Other Topics Concern   Not on file  Social History Narrative   Not on file   Social Drivers of Health   Financial Resource Strain: Low Risk  (07/26/2023)   Overall Financial Resource Strain (CARDIA)    Difficulty of Paying Living Expenses: Not hard at all  Food Insecurity: No Food Insecurity (07/26/2023)   Hunger Vital Sign    Worried About Running Out of Food in the Last Year: Never true    Ran Out of Food in the Last Year: Never true  Transportation Needs: No Transportation Needs (07/26/2023)   PRAPARE - Administrator, Civil Service (Medical): No    Lack of Transportation (Non-Medical): No  Physical Activity: Insufficiently Active (07/26/2023)   Exercise Vital Sign    Days of Exercise per Week: 3 days    Minutes of Exercise per Session: 20 min  Stress: No Stress Concern Present (07/26/2023)   [employer] of Occupational Health - Occupational Stress Questionnaire    Feeling of Stress: Not at all  Social Connections: Moderately Integrated (07/26/2023)   Social Connection and Isolation Panel    Frequency of Communication with  Friends and Family: More than three times a week    Frequency of Social Gatherings with Friends and Family: Three times a week    Attends Religious Services: More than 4 times per year    Active Member of Clubs or Organizations: Yes    Attends Banker Meetings: Not on file    Marital Status: Divorced  Objective:  BP 138/72 (BP Location: Right Arm, Patient Position: Sitting)   Pulse 61   Temp 97.8 F (36.6 C) (Temporal)   Ht 5' 4 (1.626 m)   Wt 151 lb (68.5 kg)   SpO2 98%   BMI 25.92 kg/m      09/08/2023    1:39 PM 07/28/2023    7:58 AM 01/27/2023    8:25 AM  BP/Weight  Systolic BP 138 106 104  Diastolic BP 72 64 60  Wt. (Lbs) 151 147.2 147  BMI 25.92 kg/m2 25.27 kg/m2 25.23 kg/m2    Physical Exam Vitals reviewed.  Constitutional:      Appearance: Normal appearance.  Cardiovascular:     Rate and Rhythm: Normal rate and regular rhythm.     Heart sounds: Normal heart sounds.  Pulmonary:     Effort: Pulmonary effort is normal.     Breath sounds: Normal breath sounds.  Abdominal:     General: Bowel sounds are normal.     Palpations: Abdomen is soft.     Tenderness: There is no abdominal tenderness.  Neurological:     Mental Status: She is alert and oriented to person, place, and time.  Psychiatric:        Mood and Affect: Mood normal.        Behavior: Behavior normal.       Lab Results  Component Value Date   WBC 7.2 07/28/2023   HGB 13.7 07/28/2023   HCT 42.0 07/28/2023   PLT 291 07/28/2023   GLUCOSE 80 01/27/2023   CHOL 176 07/28/2023   TRIG 70 07/28/2023   HDL 72 07/28/2023   LDLCALC 91 07/28/2023   ALT 29 01/27/2023   AST 29 01/27/2023   NA 143 01/27/2023   K 4.5 01/27/2023   CL 103 01/27/2023   CREATININE 0.79 01/27/2023   BUN 20 01/27/2023   CO2 23 01/27/2023   TSH 0.910 09/02/2023   HGBA1C 5.1 07/28/2023      Assessment & Plan:  Mixed hyperlipidemia Assessment & Plan: Weight gain of 6 pounds over 6 weeks with stable  eating habits and activity. Thyroid  function euthyroid. A1c and blood count normal. Possible causes: fluid retention, adrenal dysfunction, stress-related cortisol changes. Recent stress may affect cortisol. - Monitor weight, report further increase or new symptoms such as abdominal pain, flank pain, or increased hunger. - Evaluate adrenal function if weight gain persists, considering cortisol and ACTH levels. - Assess heart function if significant fluid retention is suspected. - Continue monitoring thyroid  function.   Other specified hypothyroidism Assessment & Plan: Graves' disease treated with radioactive iodine therapy over 30 years ago. Thyroid  function currently euthyroid. - Continue monitoring thyroid  function.         No orders of the defined types were placed in this encounter.   No orders of the defined types were placed in this encounter.    Follow-up: Return if symptoms worsen or fail to improve, for [facility].   I,Lauren M Auman,acting as a Neurosurgeon for [employer], PA.,have documented all relevant documentation on the behalf of Nola Angles, PA,as directed by  Nola Angles, PA while in the presence of Nola Angles, [place].   An After Visit Summary was printed and given to the patient.  Nola Angles, [place] [facility] [phone]

## 2023-09-08 NOTE — Assessment & Plan Note (Signed)
 Graves' disease treated with radioactive iodine therapy over 30 years ago. Thyroid  function currently euthyroid. - Continue monitoring thyroid  function.

## 2023-09-08 NOTE — Assessment & Plan Note (Signed)
 Weight gain of 6 pounds over 6 weeks with stable eating habits and activity. Thyroid  function euthyroid. A1c and blood count normal. Possible causes: fluid retention, adrenal dysfunction, stress-related cortisol changes. Recent stress may affect cortisol. - Monitor weight, report further increase or new symptoms such as abdominal pain, flank pain, or increased hunger. - Evaluate adrenal function if weight gain persists, considering cortisol and ACTH levels. - Assess heart function if significant fluid retention is suspected. - Continue monitoring thyroid  function.

## 2023-09-08 NOTE — Patient Instructions (Signed)
 VISIT SUMMARY:  Today, we discussed your recent weight gain and thyroid  function. You have gained six pounds over the past six weeks without any changes in your eating habits or physical activity. We reviewed your history of Graves' disease and recent lab results, which show your thyroid  function is now normal. We also talked about the possible impact of recent stress on your weight.  YOUR PLAN:  -ABNORMAL WEIGHT GAIN: You have gained six pounds over the past six weeks without any changes in your eating habits or physical activity. Your thyroid  function is normal, and your recent lab results, including A1c and blood count, are normal. Possible causes for the weight gain include fluid retention, adrenal dysfunction, or stress-related changes in cortisol levels. We will monitor your weight and ask you to report any further increase or new symptoms such as abdominal pain, flank pain, or increased hunger. If the weight gain persists, we may evaluate your adrenal function by checking cortisol and ACTH levels and assess your heart function if significant fluid retention is suspected.  -GRAVES' DISEASE, STATUS POST RADIOACTIVE IODINE THERAPY, NOW EUTHYROID: Graves' disease is a condition where the thyroid  is overactive. You were treated with radioactive iodine therapy over 30 years ago, and your thyroid  function is currently normal. We will continue to monitor your thyroid  function to ensure it remains stable.  INSTRUCTIONS:  Please monitor your weight and report any further increase or new symptoms such as abdominal pain, flank pain, or increased hunger. If the weight gain persists, we may need to evaluate your adrenal function and assess your heart function. Continue with your regular thyroid  function tests to ensure your levels remain stable.

## 2023-12-11 ENCOUNTER — Ambulatory Visit (INDEPENDENT_AMBULATORY_CARE_PROVIDER_SITE_OTHER)

## 2023-12-11 VITALS — BP 138/72 | Ht 64.0 in | Wt 145.0 lb

## 2023-12-11 DIAGNOSIS — Z0000 Encounter for general adult medical examination without abnormal findings: Secondary | ICD-10-CM | POA: Diagnosis not present

## 2023-12-11 NOTE — Progress Notes (Signed)
 I connected with  Janet Richardson on 12/11/23 by a audio enabled telemedicine application and verified that I am speaking with the correct person using two identifiers.  Patient Location: Home  Provider Location: Home Office  Persons Participating in Visit: Patient.  I discussed the limitations of evaluation and management by telemedicine. The patient expressed understanding and agreed to proceed.  Vital Signs: Because this visit was a virtual/telehealth visit, some criteria may be missing or patient reported. Any vitals not documented were not able to be obtained and vitals that have been documented are patient reported.   Because this visit was a virtual/telehealth visit,  certain criteria was not obtained, such a blood pressure, CBG if applicable, and timed get up and go. Any medications not marked as taking were not mentioned during the medication reconciliation part of the visit. Any vitals not documented were not able to be obtained due to this being a telehealth visit or patient was unable to self-report a recent blood pressure reading due to a lack of equipment at home via telehealth. Vitals that have been documented are verbally provided by the patient.  This visit was performed by a medical professional under my direct supervision. I was immediately available for consultation/collaboration. I have reviewed and agree with the Annual Wellness Visit documentation.  Chief Complaint  Patient presents with   Medicare Wellness     Subjective:   Janet Richardson is a 83 y.o. female who presents for a Medicare Annual Wellness Visit.  Allergies (verified) Amoxicillin -pot clavulanate, Nitrofurantoin , and Codeine   History: Past Medical History:  Diagnosis Date   Adjustment insomnia    Mixed hyperlipidemia    Osteoarthritis    Other specified hypothyroidism    Past Surgical History:  Procedure Laterality Date   CATARACT EXTRACTION Right    CATARACT EXTRACTION Left 10/2017    HIP ARTHROPLASTY Right    JOINT REPLACEMENT Right    total hip arthroplasty   THYROIDECTOMY     TONSILLECTOMY     Family History  Problem Relation Age of Onset   Heart failure Father    Cancer Brother        lung   Cancer Brother        prostate   Social History   Occupational History   Occupation: Print Production Planner    Comment: [employer]  Tobacco Use   Smoking status: Never   Smokeless tobacco: Never  Vaping Use   Vaping status: Never Used  Substance and Sexual Activity   Alcohol use: Yes    Alcohol/week: 1.0 standard drink of alcohol    Types: 1 Glasses of wine per week    Comment: twice a week   Drug use: Never   Sexual activity: Not Currently    Partners: Male   Tobacco Counseling Counseling given: Not Answered  SDOH Screenings   Food Insecurity: No Food Insecurity (12/07/2023)  Housing: Low Risk  (12/07/2023)  Transportation Needs: No Transportation Needs (12/07/2023)  Utilities: Not At Risk (12/11/2023)  Alcohol Screen: Low Risk  (12/07/2023)  Depression (PHQ2-9): Low Risk  (12/11/2023)  Financial Resource Strain: Low Risk  (12/07/2023)  Physical Activity: Insufficiently Active (12/07/2023)  Social Connections: Moderately Integrated (12/07/2023)  Stress: No Stress Concern Present (12/07/2023)  Tobacco Use: Low Risk  (12/11/2023)  Health Literacy: Adequate Health Literacy (12/11/2023)   See flowsheets for full screening details  Depression Screen PHQ 2 & 9 Depression Scale- Over the past 2 weeks, how often have you been bothered by any  of the following problems? Little interest or pleasure in doing things: 0 Feeling down, depressed, or hopeless (PHQ Adolescent also includes...irritable): 0 PHQ-2 Total Score: 0 Trouble falling or staying asleep, or sleeping too much: 0 Feeling tired or having little energy: 0 Poor appetite or overeating (PHQ Adolescent also includes...weight loss): 0 Feeling bad about yourself - or that you are a failure or have let  yourself or your family down: 0 Trouble concentrating on things, such as reading the newspaper or watching television (PHQ Adolescent also includes...like school work): 0 Moving or speaking so slowly that other people could have noticed. Or the opposite - being so fidgety or restless that you have been moving around a lot more than usual: 0 Thoughts that you would be better off dead, or of hurting yourself in some way: 0 PHQ-9 Total Score: 0 If you checked off any problems, how difficult have these problems made it for you to do your work, take care of things at home, or get along with other people?: Not difficult at all  Depression Treatment Depression Interventions/Treatment : EYV7-0 Score <4 Follow-up Not Indicated     Goals Addressed               This Visit's Progress     Patient Stated (pt-stated)        Patient would like to lose 10 lbs       Visit info / Clinical Intake: Medicare Wellness Visit Type:: Subsequent Annual Wellness Visit Persons participating in visit:: patient Medicare Wellness Visit Mode:: Telephone If telephone:: video declined Because this visit was a virtual/telehealth visit:: pt reported vitals If Telephone or Video please confirm:: I connected with the patient using audio enabled telemedicine application and verified that I am speaking with the correct person using two identifiers; I discussed the limitations of evaluation and management by telemedicine; The patient expressed understanding and agreed to proceed Patient Location:: home Provider Location:: home office Information given by:: patient Interpreter Needed?: No Pre-visit prep was completed: yes AWV questionnaire completed by patient prior to visit?: yes Date:: 12/07/23 Living arrangements:: (!) lives alone Patient's Overall Health Status Rating: very good Typical amount of pain: none Does pain affect daily life?: no Are you currently prescribed opioids?: no  Dietary Habits and  Nutritional Risks How many meals a day?: 2 Eats fruit and vegetables daily?: yes Most meals are obtained by: preparing own meals; eating out In the last 2 weeks, have you had any of the following?: none Diabetic:: no  Functional Status Activities of Daily Living (to include ambulation/medication): (Patient-Rptd) Independent Ambulation: (Patient-Rptd) Independent Medication Administration: Independent Home Management: (Patient-Rptd) Independent Manage your own finances?: yes Primary transportation is: driving Concerns about vision?: no *vision screening is required for WTM* Concerns about hearing?: no  Fall Screening Falls in the past year?: (Patient-Rptd) 0 Number of falls in past year: 0 Was there an injury with Fall?: 0 Fall Risk Category Calculator: 0 Patient Fall Risk Level: Low Fall Risk  Fall Risk Patient at Risk for Falls Due to: No Fall Risks Fall risk Follow up: Falls evaluation completed; Falls prevention discussed  Home and Transportation Safety: All rugs have non-skid backing?: N/A, no rugs All stairs or steps have railings?: N/A, no stairs Grab bars in the bathtub or shower?: yes Have non-skid surface in bathtub or shower?: yes Good home lighting?: yes Regular seat belt use?: yes Hospital stays in the last year:: no  Cognitive Assessment Difficulty concentrating, remembering, or making decisions? : no Will 6CIT  or Mini Cog be Completed: no 6CIT or Mini Cog Declined: patient alert, oriented, able to answer questions appropriately and recall recent events  Advance Directives (For Healthcare) Does Patient Have a Medical Advance Directive?: Yes Does patient want to make changes to medical advance directive?: No - Patient declined Type of Advance Directive: Healthcare Power of [place]; Living will; Out of facility DNR (pink MOST or yellow form) Copy of Healthcare Power of Attorney in Chart?: No - copy requested Copy of Living Will in Chart?: No - copy  requested Out of facility DNR (pink MOST or yellow form) in Chart? (Ambulatory ONLY): No - copy requested  Reviewed/Updated  Reviewed/Updated: Reviewed All (Medical, Surgical, Family, Medications, Allergies, Care Teams, Patient Goals)        Objective:    Today's Vitals   12/11/23 1412  BP: 138/72  Weight: 145 lb (65.8 kg)  Height: 5' 4 (1.626 m)   Body mass index is 24.89 kg/m.  Current Medications (verified) Outpatient Encounter Medications as of 12/11/2023  Medication Sig   Calcium-Vitamin D -Vitamin K (VIACTIV CALCIUM PLUS D PO) Take 650 mg by mouth in the morning and at bedtime.   levothyroxine  (SYNTHROID ) 88 MCG tablet Take 1 tablet (88 mcg total) by mouth daily.   Multiple Vitamins-Minerals (PRESERVISION AREDS PO) Take by mouth.   simvastatin  (ZOCOR ) 20 MG tablet TAKE 1 TABLET BY MOUTH EVERYDAY AT BEDTIME   No facility-administered encounter medications on file as of 12/11/2023.   Hearing/Vision screen Hearing Screening - Comments:: No difficulties  Vision Screening - Comments:: Wears readers Immunizations and Health Maintenance Health Maintenance  Topic Date Due   Zoster Vaccines- Shingrix (1 of 2) 09/02/1991   Medicare Annual Wellness (AWV)  09/26/2023   DTaP/Tdap/Td (2 - Td or Tdap) 07/27/2024 (Originally 02/19/2021)   DEXA SCAN  07/27/2024 (Originally 12/05/2021)   Mammogram  05/07/2024   COVID-19 Vaccine (9 - 2025-26 season) 05/31/2024   Pneumococcal Vaccine: 50+ Years  Completed   Influenza Vaccine  Completed   Meningococcal B Vaccine  Aged Out        Assessment/Plan:  This is a routine wellness examination for Yuleni.  Patient Care Team: Milon Cleaves, [place] as PCP - General (Physician Assistant) Rox Charleston, MD as Consulting Physician (Obstetrics and Gynecology)  I have personally reviewed and noted the following in the patient's chart:   Medical and social history Use of alcohol, tobacco or illicit drugs  Current medications and supplements  including opioid prescriptions. Functional ability and status Nutritional status Physical activity Advanced directives List of other physicians Hospitalizations, surgeries, and ER visits in previous 12 months Vitals Screenings to include cognitive, depression, and falls Referrals and appointments  No orders of the defined types were placed in this encounter.  In addition, I have reviewed and discussed with patient certain preventive protocols, quality metrics, and best practice recommendations. A written personalized care plan for preventive services as well as general preventive health recommendations were provided to patient.   Lyle MARLA Right, [place]   12/11/2023   No follow-ups on file.  After Visit Summary: (MyChart) Due to this being a telephonic visit, the after visit summary with patients personalized plan was offered to patient via MyChart   Nurse Notes: nothing to report

## 2023-12-11 NOTE — Patient Instructions (Signed)
 Janet Richardson,  Thank you for taking the time for your Medicare Wellness Visit. I appreciate your continued commitment to your health goals. Please review the care plan we discussed, and feel free to reach out if I can assist you further.  Please note that Annual Wellness Visits do not include a physical exam. Some assessments may be limited, especially if the visit was conducted virtually. If needed, we may recommend an in-person follow-up with your provider.  Ongoing Care Seeing your primary care provider every 3 to 6 months helps us  monitor your health and provide consistent, personalized care.  Referrals If a referral was made during today's visit and you haven't received any updates within two weeks, please contact the referred provider directly to check on the status.  Recommended Screenings:  Health Maintenance  Topic Date Due   Zoster (Shingles) Vaccine (1 of 2) 09/02/1991   Medicare Annual Wellness Visit  09/26/2023   DTaP/Tdap/Td vaccine (2 - Td or Tdap) 07/27/2024*   DEXA scan (bone density measurement)  07/27/2024*   Breast Cancer Screening  05/07/2024   COVID-19 Vaccine (8 - 2025-26 season) 05/31/2024   Pneumococcal Vaccine for age over 4  Completed   Flu Shot  Completed   Meningitis B Vaccine  Aged Out  *Topic was postponed. The date shown is not the original due date.       12/07/2023    3:16 PM  Advanced Directives  Does Patient Have a Medical Advance Directive? Yes  Type of Estate Agent of [place];Living will;Out of facility DNR (pink MOST or yellow form)  Does patient want to make changes to medical advance directive? No - Patient declined  Copy of Healthcare Power of Attorney in Chart? No - copy requested    Vision: Annual vision screenings are recommended for early detection of glaucoma, cataracts, and diabetic retinopathy. These exams can also reveal signs of chronic conditions such as diabetes and high blood pressure.  Dental: Annual  dental screenings help detect early signs of oral cancer, gum disease, and other conditions linked to overall health, including heart disease and diabetes.  Please see the attached documents for additional preventive care recommendations.

## 2024-01-26 ENCOUNTER — Ambulatory Visit (INDEPENDENT_AMBULATORY_CARE_PROVIDER_SITE_OTHER): Admitting: Family Medicine

## 2024-01-26 ENCOUNTER — Encounter: Payer: Self-pay | Admitting: Physician Assistant

## 2024-01-26 VITALS — BP 118/68 | HR 71 | Temp 98.0°F | Resp 18 | Ht 64.0 in | Wt 149.5 lb

## 2024-01-26 DIAGNOSIS — E038 Other specified hypothyroidism: Secondary | ICD-10-CM

## 2024-01-26 DIAGNOSIS — E782 Mixed hyperlipidemia: Secondary | ICD-10-CM | POA: Diagnosis not present

## 2024-01-26 DIAGNOSIS — Z780 Asymptomatic menopausal state: Secondary | ICD-10-CM | POA: Diagnosis not present

## 2024-01-26 DIAGNOSIS — M8580 Other specified disorders of bone density and structure, unspecified site: Secondary | ICD-10-CM | POA: Diagnosis not present

## 2024-01-26 LAB — CBC WITH DIFFERENTIAL/PLATELET
Basophils Absolute: 0 x10E3/uL (ref 0.0–0.2)
Basos: 1 %
EOS (ABSOLUTE): 0.1 x10E3/uL (ref 0.0–0.4)
Eos: 2 %
Hematocrit: 43.7 % (ref 34.0–46.6)
Hemoglobin: 14 g/dL (ref 11.1–15.9)
Immature Grans (Abs): 0 x10E3/uL (ref 0.0–0.1)
Immature Granulocytes: 0 %
Lymphocytes Absolute: 2.3 x10E3/uL (ref 0.7–3.1)
Lymphs: 31 %
MCH: 32.3 pg (ref 26.6–33.0)
MCHC: 32 g/dL (ref 31.5–35.7)
MCV: 101 fL — ABNORMAL HIGH (ref 79–97)
Monocytes Absolute: 0.6 x10E3/uL (ref 0.1–0.9)
Monocytes: 9 %
Neutrophils Absolute: 4.3 x10E3/uL (ref 1.4–7.0)
Neutrophils: 57 %
Platelets: 325 x10E3/uL (ref 150–450)
RBC: 4.33 x10E6/uL (ref 3.77–5.28)
RDW: 12 % (ref 11.7–15.4)
WBC: 7.3 x10E3/uL (ref 3.4–10.8)

## 2024-01-26 LAB — COMPREHENSIVE METABOLIC PANEL WITH GFR
ALT: 26 IU/L (ref 0–32)
AST: 23 IU/L (ref 0–40)
Albumin: 4.6 g/dL (ref 3.7–4.7)
Alkaline Phosphatase: 73 IU/L (ref 48–129)
BUN/Creatinine Ratio: 16 (ref 12–28)
BUN: 12 mg/dL (ref 8–27)
Bilirubin Total: 1.1 mg/dL (ref 0.0–1.2)
CO2: 23 mmol/L (ref 20–29)
Calcium: 9.7 mg/dL (ref 8.7–10.3)
Chloride: 104 mmol/L (ref 96–106)
Creatinine, Ser: 0.73 mg/dL (ref 0.57–1.00)
Globulin, Total: 2 g/dL (ref 1.5–4.5)
Glucose: 82 mg/dL (ref 70–99)
Potassium: 4.6 mmol/L (ref 3.5–5.2)
Sodium: 141 mmol/L (ref 134–144)
Total Protein: 6.6 g/dL (ref 6.0–8.5)
eGFR: 82 mL/min/1.73

## 2024-01-26 LAB — LIPID PANEL
Chol/HDL Ratio: 2.4 ratio (ref 0.0–4.4)
Cholesterol, Total: 202 mg/dL — ABNORMAL HIGH (ref 100–199)
HDL: 83 mg/dL
LDL Chol Calc (NIH): 103 mg/dL — ABNORMAL HIGH (ref 0–99)
Triglycerides: 94 mg/dL (ref 0–149)
VLDL Cholesterol Cal: 16 mg/dL (ref 5–40)

## 2024-01-26 LAB — T4, FREE: Free T4: 1.62 ng/dL (ref 0.82–1.77)

## 2024-01-26 LAB — TSH: TSH: 2.88 u[IU]/mL (ref 0.450–4.500)

## 2024-01-26 NOTE — Assessment & Plan Note (Addendum)
 Order bone density.  Continue calcium supplementation Orders:   HM DEXA SCAN

## 2024-01-26 NOTE — Patient Instructions (Addendum)
 Recommend get shingles vaccine series and tetanus (tdap) at your pharmacy. Recommend continue to work on eating healthy diet and exercise. I am ordering a bone density for osteoporosis screening.  If I have ordered a referral, lab work, or a test, please watch for messages/letters in your [place]. Please be aware of unknown numbers, as this may be a specialist's office attempting to call and schedule your appointment. You may wish to enter the specialist's phone number in your contacts, so your phone will not block the calls as SPAM. If you have NOT been contacted with in 2 weeks: Please call the specialist's office  Call [facility].

## 2024-01-26 NOTE — Progress Notes (Signed)
 "  Subjective:  Patient ID: Janet Richardson, female    DOB: May 25, 1940  Age: 83 y.o. MRN: [id number]  Chief Complaint  Patient presents with   Medical Management of Chronic Issues    HPI: Discussed the use of AI scribe software for clinical note transcription with the patient, who gave verbal consent to proceed.  Patient is an 83 year old white female who presents for follow-up of hyperlipidemia, hypothyroidism and osteopenia.  Hyperlipidemia - Simvastatin  20 mg nightly.  Patient eats fairly healthy.  She is very active but has no formal exercise program.  Hypothyroidism - On levothyroxine  88 mcg daily.  Osteopenia On calcium with vitamin D  (Viactiv) twice daily and takes PreserVision AREDS MVI.       01/26/2024    7:48 AM 12/11/2023    2:18 PM 09/26/2022    9:55 AM 07/25/2022    9:55 AM 09/12/2021   10:17 AM  Depression screen PHQ 2/9  Decreased Interest 0 0 0 0 0  Down, Depressed, Hopeless 0 0 0 0 0  PHQ - 2 Score 0 0 0 0 0  Altered sleeping 0 0  0   Tired, decreased energy 0 0  0   Change in appetite 0 0  0   Feeling bad or failure about yourself  0 0  0   Trouble concentrating 0 0  0   Moving slowly or fidgety/restless 0 0  0   Suicidal thoughts 0 0  0   PHQ-9 Score 0 0  0    Difficult doing work/chores Not difficult at all Not difficult at all  Not difficult at all      Data saved with a previous flowsheet row definition        01/26/2024    7:48 AM  Fall Risk   Number falls in past yr: 0  Injury with Fall? 0  Risk for fall due to : No Fall Risks  Follow up Falls evaluation completed    Patient Care Team: Milon Cleaves, [place] as PCP - General (Physician Assistant) Rox Charleston, MD as Consulting Physician (Obstetrics and Gynecology)   Review of Systems  Constitutional:  Negative for chills, diaphoresis, fatigue and fever.  HENT:  Negative for congestion, ear pain and sinus pain.   Eyes: Negative.   Respiratory:  Negative for cough and shortness of breath.    Cardiovascular:  Negative for chest pain.  Gastrointestinal:  Negative for abdominal pain, constipation, diarrhea, nausea and vomiting.  Endocrine: Negative.   Genitourinary:  Negative for dysuria.  Musculoskeletal:  Negative for arthralgias.  Allergic/Immunologic: Negative.   Neurological:  Negative for dizziness, weakness, light-headedness and headaches.  Hematological: Negative.   Psychiatric/Behavioral:  Negative for dysphoric mood. The patient is not nervous/anxious.     Medications Ordered Prior to Encounter[1] Past Medical History:  Diagnosis Date   Adjustment insomnia    Mixed hyperlipidemia    Osteoarthritis    Other specified hypothyroidism    Past Surgical History:  Procedure Laterality Date   CATARACT EXTRACTION Right    CATARACT EXTRACTION Left 10/2017   HIP ARTHROPLASTY Right    JOINT REPLACEMENT Right    total hip arthroplasty   THYROIDECTOMY     TONSILLECTOMY      Family History  Problem Relation Age of Onset   Heart failure Father    Cancer Brother        lung   Cancer Brother        prostate   Social History   Socioeconomic  History   Marital status: Divorced    Spouse name: Not on file   Number of children: 2   Years of education: Not on file   Highest education level: Associate degree: occupational, scientist, product/process development, or vocational program  Occupational History   Occupation: Print Production Planner    Comment: [employer]  Tobacco Use   Smoking status: Never   Smokeless tobacco: Never  Vaping Use   Vaping status: Never Used  Substance and Sexual Activity   Alcohol use: Yes    Alcohol/week: 1.0 standard drink of alcohol    Types: 1 Glasses of wine per week    Comment: twice a week   Drug use: Never   Sexual activity: Not Currently    Partners: Male  Other Topics Concern   Not on file  Social History Narrative   Not on file   Social Drivers of Health   Tobacco Use: Low Risk (01/26/2024)   Patient History    Smoking Tobacco Use: Never     Smokeless Tobacco Use: Never    Passive Exposure: Not on file  Financial Resource Strain: Low Risk (12/07/2023)   Overall Financial Resource Strain (CARDIA)    Difficulty of Paying Living Expenses: Not hard at all  Food Insecurity: No Food Insecurity (12/07/2023)   Epic    Worried About Programme Researcher, Broadcasting/film/video in the Last Year: Never true    Ran Out of Food in the Last Year: Never true  Transportation Needs: No Transportation Needs (12/07/2023)   Epic    Lack of Transportation (Medical): No    Lack of Transportation (Non-Medical): No  Physical Activity: Insufficiently Active (12/07/2023)   Exercise Vital Sign    Days of Exercise per Week: 5 days    Minutes of Exercise per Session: 20 min  Stress: No Stress Concern Present (12/07/2023)   [employer] of Occupational Health - Occupational Stress Questionnaire    Feeling of Stress: Only a little  Social Connections: Moderately Integrated (12/07/2023)   Social Connection and Isolation Panel    Frequency of Communication with Friends and Family: More than three times a week    Frequency of Social Gatherings with Friends and Family: Once a week    Attends Religious Services: More than 4 times per year    Active Member of Clubs or Organizations: Yes    Attends Banker Meetings: More than 4 times per year    Marital Status: Divorced  Depression (PHQ2-9): Low Risk (01/26/2024)   Depression (PHQ2-9)    PHQ-2 Score: 0  Alcohol Screen: Low Risk (12/07/2023)   Alcohol Screen    Last Alcohol Screening Score (AUDIT): 3  Housing: Low Risk (12/07/2023)   Epic    Unable to Pay for Housing in the Last Year: No    Number of Times Moved in the Last Year: 0    Homeless in the Last Year: No  Utilities: Not At Risk (12/11/2023)   Epic    Threatened with loss of utilities: No  Health Literacy: Adequate Health Literacy (12/11/2023)   B1300 Health Literacy    Frequency of need for help with medical instructions: Never    Objective:  BP  118/68   Pulse 71   Temp 98 F (36.7 C) (Temporal)   Resp 18   Ht 5' 4 (1.626 m)   Wt 149 lb 8 oz (67.8 kg)   SpO2 99%   BMI 25.66 kg/m      01/26/2024    7:45 AM 12/11/2023  2:12 PM 09/08/2023    1:39 PM  BP/Weight  Systolic BP 118 138 138  Diastolic BP 68 72 72  Wt. (Lbs) 149.5 145 151  BMI 25.66 kg/m2 24.89 kg/m2 25.92 kg/m2    Physical Exam Vitals reviewed.  Constitutional:      Appearance: Normal appearance. She is normal weight.  Neck:     Vascular: No carotid bruit.  Cardiovascular:     Rate and Rhythm: Normal rate and regular rhythm.     Heart sounds: Normal heart sounds.  Pulmonary:     Effort: Pulmonary effort is normal. No respiratory distress.     Breath sounds: Normal breath sounds.  Abdominal:     General: Abdomen is flat. Bowel sounds are normal.     Palpations: Abdomen is soft.     Tenderness: There is no abdominal tenderness.  Neurological:     Mental Status: She is alert and oriented to person, place, and time.  Psychiatric:        Mood and Affect: Mood normal.        Behavior: Behavior normal.         Lab Results  Component Value Date   WBC 7.2 07/28/2023   HGB 13.7 07/28/2023   HCT 42.0 07/28/2023   PLT 291 07/28/2023   GLUCOSE 80 01/27/2023   CHOL 176 07/28/2023   TRIG 70 07/28/2023   HDL 72 07/28/2023   LDLCALC 91 07/28/2023   ALT 29 01/27/2023   AST 29 01/27/2023   NA 143 01/27/2023   K 4.5 01/27/2023   CL 103 01/27/2023   CREATININE 0.79 01/27/2023   BUN 20 01/27/2023   CO2 23 01/27/2023   TSH 0.910 09/02/2023   HGBA1C 5.1 07/28/2023    Results for orders placed or performed in visit on 09/02/23  TSH   Collection Time: 09/02/23  8:18 AM  Result Value Ref Range   TSH 0.910 0.450 - 4.500 uIU/mL  .  Assessment & Plan:   Assessment & Plan Mixed hyperlipidemia -Check lipids.  Encouraged to continue to eat healthy and exercise. - Was well-controlled at last check 6 months ago.  Orders:   CBC with  Differential/Platelet   Comprehensive metabolic panel with GFR   Lipid panel  Other specified hypothyroidism Check labs.  Adjust levothyroxine  as needed Orders:   T4, Richardson   TSH  Osteopenia after menopause Order bone density.  Continue calcium supplementation Orders:   HM DEXA SCAN  General Health Recommend get Tdap and shingles vaccine series at your pharmacy.  Body mass index is 25.66 kg/m.  Assessment and Plan      No orders of the defined types were placed in this encounter.   Orders Placed This Encounter  Procedures   HM DEXA SCAN   CBC with Differential/Platelet   Comprehensive metabolic panel with GFR   Lipid panel   T4, Richardson   TSH     Follow-up: Return in about 6 months (around 07/26/2024) for chronic follow up.  An After Visit Summary was printed and given to the patient.  Janet Free, MD [facility] [phone]     [1]  Current Outpatient Medications on File Prior to Visit  Medication Sig Dispense Refill   Calcium-Vitamin D -Vitamin K (VIACTIV CALCIUM PLUS D PO) Take 650 mg by mouth in the morning and at bedtime.     levothyroxine  (SYNTHROID ) 88 MCG tablet Take 1 tablet (88 mcg total) by mouth daily. 90 tablet 3   Multiple Vitamins-Minerals (PRESERVISION AREDS  PO) Take by mouth.     simvastatin  (ZOCOR ) 20 MG tablet TAKE 1 TABLET BY MOUTH EVERYDAY AT BEDTIME 90 tablet 1   No current facility-administered medications on file prior to visit.   "

## 2024-01-26 NOTE — Assessment & Plan Note (Addendum)
 Check labs.  Adjust levothyroxine  as needed Orders:   T4, free   TSH

## 2024-01-26 NOTE — Assessment & Plan Note (Addendum)
-  Check lipids.  Encouraged to continue to eat healthy and exercise. - Was well-controlled at last check 6 months ago.  Orders:   CBC with Differential/Platelet   Comprehensive metabolic panel with GFR   Lipid panel

## 2024-01-29 ENCOUNTER — Ambulatory Visit: Payer: Self-pay | Admitting: Family Medicine

## 2024-01-30 ENCOUNTER — Other Ambulatory Visit: Payer: Self-pay | Admitting: Physician Assistant

## 2024-01-30 DIAGNOSIS — E782 Mixed hyperlipidemia: Secondary | ICD-10-CM

## 2024-01-31 LAB — B12 AND FOLATE PANEL
Folate: 9.5 ng/mL
Vitamin B-12: 254 pg/mL (ref 232–1245)

## 2024-01-31 LAB — SPECIMEN STATUS REPORT

## 2024-07-27 ENCOUNTER — Ambulatory Visit: Admitting: Physician Assistant
